# Patient Record
Sex: Male | Born: 1945 | Marital: Married | Smoking: Former smoker
Health system: Southern US, Community
[De-identification: ages and names within clinical notes are randomized; demographics above are authoritative.]

## PROBLEM LIST (undated history)

## (undated) DIAGNOSIS — J449 Chronic obstructive pulmonary disease, unspecified: Secondary | ICD-10-CM

## (undated) DIAGNOSIS — I119 Hypertensive heart disease without heart failure: Secondary | ICD-10-CM

## (undated) DIAGNOSIS — E785 Hyperlipidemia, unspecified: Secondary | ICD-10-CM

## (undated) DIAGNOSIS — R55 Syncope and collapse: Secondary | ICD-10-CM

## (undated) DIAGNOSIS — I2510 Atherosclerotic heart disease of native coronary artery without angina pectoris: Secondary | ICD-10-CM

## (undated) DIAGNOSIS — I428 Other cardiomyopathies: Secondary | ICD-10-CM

## (undated) DIAGNOSIS — I5042 Chronic combined systolic (congestive) and diastolic (congestive) heart failure: Secondary | ICD-10-CM

## (undated) HISTORY — DX: Syncope and collapse: R55

## (undated) HISTORY — DX: Atherosclerotic heart disease of native coronary artery without angina pectoris: I25.10

## (undated) HISTORY — DX: Chronic obstructive pulmonary disease, unspecified: J44.9

## (undated) HISTORY — DX: Other cardiomyopathies: I42.8

## (undated) HISTORY — DX: Hyperlipidemia, unspecified: E78.5

## (undated) HISTORY — DX: Hypertensive heart disease without heart failure: I11.9

## (undated) HISTORY — DX: Chronic combined systolic (congestive) and diastolic (congestive) heart failure: I50.42

---

## 2013-07-23 HISTORY — PX: CARDIAC CATHETERIZATION: SHX172

## 2013-08-05 ENCOUNTER — Inpatient Hospital Stay: Payer: Self-pay | Admitting: Internal Medicine

## 2013-08-05 LAB — CBC
HCT: 42.1 % (ref 40.0–52.0)
HGB: 13.8 g/dL (ref 13.0–18.0)
MCH: 28.1 pg (ref 26.0–34.0)
MCHC: 32.9 g/dL (ref 32.0–36.0)
MCV: 85 fL (ref 80–100)
Platelet: 407 10*3/uL (ref 150–440)
RBC: 4.93 10*6/uL (ref 4.40–5.90)
RDW: 13.6 % (ref 11.5–14.5)
WBC: 9.9 10*3/uL (ref 3.8–10.6)

## 2013-08-05 LAB — BASIC METABOLIC PANEL
ANION GAP: 2 — AB (ref 7–16)
BUN: 15 mg/dL (ref 7–18)
CALCIUM: 8.6 mg/dL (ref 8.5–10.1)
Chloride: 92 mmol/L — ABNORMAL LOW (ref 98–107)
Co2: 33 mmol/L — ABNORMAL HIGH (ref 21–32)
Creatinine: 0.89 mg/dL (ref 0.60–1.30)
EGFR (Non-African Amer.): >60
Glucose: 137 mg/dL — ABNORMAL HIGH (ref 65–99)
Osmolality: 258 (ref 275–301)
POTASSIUM: 4.4 mmol/L (ref 3.5–5.1)
SODIUM: 127 mmol/L — AB (ref 136–145)

## 2013-08-05 LAB — TROPONIN I
TROPONIN-I: 0.06 ng/mL — AB
TROPONIN-I: 0.08 ng/mL — AB

## 2013-08-05 LAB — HEPATIC FUNCTION PANEL A (ARMC)
ALBUMIN: 2.8 g/dL — AB (ref 3.4–5.0)
ALT: 41 U/L (ref 12–78)
Alkaline Phosphatase: 165 U/L — ABNORMAL HIGH
BILIRUBIN DIRECT: 0.2 mg/dL (ref 0.00–0.20)
Bilirubin,Total: 0.6 mg/dL (ref 0.2–1.0)
SGOT(AST): 29 U/L (ref 15–37)
TOTAL PROTEIN: 6.7 g/dL (ref 6.4–8.2)

## 2013-08-05 LAB — CK TOTAL AND CKMB (NOT AT ARMC)
CK, Total: 183 U/L (ref 35–232)
CK-MB: 6.4 ng/mL — AB (ref 0.5–3.6)

## 2013-08-05 LAB — PRO B NATRIURETIC PEPTIDE: B-TYPE NATIURETIC PEPTID: 30491 pg/mL — AB (ref 0–125)

## 2013-08-06 DIAGNOSIS — I059 Rheumatic mitral valve disease, unspecified: Secondary | ICD-10-CM

## 2013-08-06 DIAGNOSIS — I471 Supraventricular tachycardia, unspecified: Secondary | ICD-10-CM

## 2013-08-06 DIAGNOSIS — I10 Essential (primary) hypertension: Secondary | ICD-10-CM

## 2013-08-06 DIAGNOSIS — I428 Other cardiomyopathies: Secondary | ICD-10-CM

## 2013-08-06 DIAGNOSIS — I509 Heart failure, unspecified: Secondary | ICD-10-CM

## 2013-08-06 LAB — CBC WITH DIFFERENTIAL/PLATELET
BASOS PCT: 0.7 %
Basophil #: 0.1 10*3/uL (ref 0.0–0.1)
EOS ABS: 0 10*3/uL (ref 0.0–0.7)
Eosinophil %: 0.4 %
HCT: 38.9 % — AB (ref 40.0–52.0)
HGB: 12.9 g/dL — ABNORMAL LOW (ref 13.0–18.0)
LYMPHS PCT: 19.1 %
Lymphocyte #: 1.7 10*3/uL (ref 1.0–3.6)
MCH: 28.1 pg (ref 26.0–34.0)
MCHC: 33.2 g/dL (ref 32.0–36.0)
MCV: 85 fL (ref 80–100)
MONO ABS: 0.7 x10 3/mm (ref 0.2–1.0)
MONOS PCT: 8.3 %
NEUTROS ABS: 6.3 10*3/uL (ref 1.4–6.5)
NEUTROS PCT: 71.5 %
PLATELETS: 369 10*3/uL (ref 150–440)
RBC: 4.59 10*6/uL (ref 4.40–5.90)
RDW: 13.8 % (ref 11.5–14.5)
WBC: 8.9 10*3/uL (ref 3.8–10.6)

## 2013-08-06 LAB — BASIC METABOLIC PANEL
ANION GAP: 6 — AB (ref 7–16)
BUN: 15 mg/dL (ref 7–18)
CALCIUM: 8.4 mg/dL — AB (ref 8.5–10.1)
CREATININE: 0.85 mg/dL (ref 0.60–1.30)
Chloride: 93 mmol/L — ABNORMAL LOW (ref 98–107)
Co2: 32 mmol/L (ref 21–32)
EGFR (African American): >60
EGFR (Non-African Amer.): >60
Glucose: 87 mg/dL (ref 65–99)
Osmolality: 263 (ref 275–301)
Potassium: 3.6 mmol/L (ref 3.5–5.1)
SODIUM: 131 mmol/L — AB (ref 136–145)

## 2013-08-06 LAB — LIPID PANEL
CHOLESTEROL: 98 mg/dL (ref 0–200)
HDL Cholesterol: 23 mg/dL — ABNORMAL LOW (ref 40–60)
LDL CHOLESTEROL, CALC: 59 mg/dL (ref 0–100)
Triglycerides: 80 mg/dL (ref 0–200)
VLDL CHOLESTEROL, CALC: 16 mg/dL (ref 5–40)

## 2013-08-06 LAB — TROPONIN I: Troponin-I: 0.09 ng/mL — ABNORMAL HIGH

## 2013-08-07 DIAGNOSIS — I5021 Acute systolic (congestive) heart failure: Secondary | ICD-10-CM

## 2013-08-07 LAB — BASIC METABOLIC PANEL
Anion Gap: 5 — ABNORMAL LOW (ref 7–16)
BUN: 17 mg/dL (ref 7–18)
CALCIUM: 8 mg/dL — AB (ref 8.5–10.1)
CHLORIDE: 88 mmol/L — AB (ref 98–107)
Co2: 37 mmol/L — ABNORMAL HIGH (ref 21–32)
Creatinine: 0.88 mg/dL (ref 0.60–1.30)
EGFR (Non-African Amer.): >60
Glucose: 77 mg/dL (ref 65–99)
Osmolality: 261 (ref 275–301)
Potassium: 2.9 mmol/L — ABNORMAL LOW (ref 3.5–5.1)
Sodium: 130 mmol/L — ABNORMAL LOW (ref 136–145)

## 2013-08-07 LAB — MAGNESIUM: Magnesium: 1.4 mg/dL — ABNORMAL LOW

## 2013-08-08 LAB — BASIC METABOLIC PANEL
Anion Gap: 1 — ABNORMAL LOW (ref 7–16)
BUN: 22 mg/dL — ABNORMAL HIGH (ref 7–18)
Calcium, Total: 8.6 mg/dL (ref 8.5–10.1)
Chloride: 86 mmol/L — ABNORMAL LOW (ref 98–107)
Co2: 36 mmol/L — ABNORMAL HIGH (ref 21–32)
Creatinine: 1.06 mg/dL (ref 0.60–1.30)
EGFR (African American): >60
EGFR (Non-African Amer.): >60
Glucose: 147 mg/dL — ABNORMAL HIGH (ref 65–99)
Osmolality: 254 (ref 275–301)
Potassium: 5 mmol/L (ref 3.5–5.1)
Sodium: 123 mmol/L — ABNORMAL LOW (ref 136–145)

## 2013-08-08 LAB — MAGNESIUM: Magnesium: 2.4 mg/dL

## 2013-08-09 LAB — CREATININE, SERUM
Creatinine: 1.28 mg/dL (ref 0.60–1.30)
EGFR (African American): >60
EGFR (Non-African Amer.): 58 — ABNORMAL LOW

## 2013-08-09 LAB — BASIC METABOLIC PANEL
ANION GAP: 6 — AB (ref 7–16)
BUN: 33 mg/dL — ABNORMAL HIGH (ref 7–18)
CHLORIDE: 83 mmol/L — AB (ref 98–107)
CREATININE: 1.11 mg/dL (ref 0.60–1.30)
Calcium, Total: 8.6 mg/dL (ref 8.5–10.1)
Co2: 34 mmol/L — ABNORMAL HIGH (ref 21–32)
EGFR (African American): >60
Glucose: 97 mg/dL (ref 65–99)
Osmolality: 255 (ref 275–301)
Potassium: 5 mmol/L (ref 3.5–5.1)
SODIUM: 123 mmol/L — AB (ref 136–145)

## 2013-08-09 LAB — SODIUM: Sodium: 121 mmol/L — ABNORMAL LOW (ref 136–145)

## 2013-08-09 LAB — BUN: BUN: 34 mg/dL — AB (ref 7–18)

## 2013-08-09 LAB — MAGNESIUM: Magnesium: 2.5 mg/dL — ABNORMAL HIGH

## 2013-08-09 LAB — SODIUM, URINE, RANDOM: Sodium, Urine Random: 14 mmol/L (ref 20–110)

## 2013-08-10 LAB — MAGNESIUM: Magnesium: 1.8 mg/dL

## 2013-08-10 LAB — BASIC METABOLIC PANEL
Anion Gap: 4 — ABNORMAL LOW (ref 7–16)
BUN: 34 mg/dL — ABNORMAL HIGH (ref 7–18)
CO2: 31 mmol/L (ref 21–32)
Calcium, Total: 8.4 mg/dL — ABNORMAL LOW (ref 8.5–10.1)
Chloride: 86 mmol/L — ABNORMAL LOW (ref 98–107)
Creatinine: 0.99 mg/dL (ref 0.60–1.30)
Glucose: 109 mg/dL — ABNORMAL HIGH (ref 65–99)
Osmolality: 252 (ref 275–301)
POTASSIUM: 4.5 mmol/L (ref 3.5–5.1)
SODIUM: 121 mmol/L — AB (ref 136–145)

## 2013-08-10 LAB — TROPONIN I
TROPONIN-I: 0.1 ng/mL — AB
Troponin-I: 0.11 ng/mL — ABNORMAL HIGH
Troponin-I: 0.11 ng/mL — ABNORMAL HIGH

## 2013-08-11 ENCOUNTER — Telehealth: Payer: Self-pay

## 2013-08-11 LAB — BASIC METABOLIC PANEL
Anion Gap: 3 — ABNORMAL LOW (ref 7–16)
BUN: 29 mg/dL — ABNORMAL HIGH (ref 7–18)
Calcium, Total: 8.2 mg/dL — ABNORMAL LOW (ref 8.5–10.1)
Chloride: 86 mmol/L — ABNORMAL LOW (ref 98–107)
Co2: 33 mmol/L — ABNORMAL HIGH (ref 21–32)
Creatinine: 1.11 mg/dL (ref 0.60–1.30)
GLUCOSE: 99 mg/dL (ref 65–99)
Osmolality: 252 (ref 275–301)
Potassium: 4.7 mmol/L (ref 3.5–5.1)
Sodium: 122 mmol/L — ABNORMAL LOW (ref 136–145)

## 2013-08-11 LAB — SODIUM
SODIUM: 123 mmol/L — AB (ref 136–145)
Sodium: 121 mmol/L — ABNORMAL LOW (ref 136–145)
Sodium: 124 mmol/L — ABNORMAL LOW (ref 136–145)
Sodium: 124 mmol/L — ABNORMAL LOW (ref 136–145)
Sodium: 125 mmol/L — ABNORMAL LOW (ref 136–145)

## 2013-08-11 NOTE — Telephone Encounter (Signed)
Pt's wife calling stating that pt is currently inpt at [facility] in room #245. She was told by Dr. Kirke Corin that pt may possibly be scheduled for a cath on Thursday and asked that she call the office to discuss. Advised pt that inpt procedures are usually scheduled thru the hospital, but she states that Dr. Kirke Corin told her to call and speak w/ his nurse.

## 2013-08-11 NOTE — Telephone Encounter (Signed)
I called her

## 2013-08-11 NOTE — Telephone Encounter (Signed)
Patient wife would like to know if Dr. Kirke Corin is for sure doing a cath and if he has not decided yet how and when will he decide. Patients wife states that patient has been confused ever since he has been in hospital so please keep her informed as well.

## 2013-08-12 ENCOUNTER — Telehealth: Payer: Self-pay

## 2013-08-12 LAB — BASIC METABOLIC PANEL
ANION GAP: 3 — AB (ref 7–16)
BUN: 22 mg/dL — ABNORMAL HIGH (ref 7–18)
CHLORIDE: 94 mmol/L — AB (ref 98–107)
CO2: 34 mmol/L — AB (ref 21–32)
CREATININE: 1.04 mg/dL (ref 0.60–1.30)
Calcium, Total: 8.4 mg/dL — ABNORMAL LOW (ref 8.5–10.1)
EGFR (African American): >60
EGFR (Non-African Amer.): >60
GLUCOSE: 85 mg/dL (ref 65–99)
Osmolality: 265 (ref 275–301)
POTASSIUM: 3.7 mmol/L (ref 3.5–5.1)
Sodium: 131 mmol/L — ABNORMAL LOW (ref 136–145)

## 2013-08-12 LAB — SODIUM
SODIUM: 132 mmol/L — AB (ref 136–145)
Sodium: 130 mmol/L — ABNORMAL LOW (ref 136–145)
Sodium: 132 mmol/L — ABNORMAL LOW (ref 136–145)
Sodium: 135 mmol/L — ABNORMAL LOW (ref 136–145)
Sodium: 136 mmol/L (ref 136–145)

## 2013-08-12 NOTE — Telephone Encounter (Signed)
Dr. Kirke Corin returned call to patient.

## 2013-08-12 NOTE — Telephone Encounter (Signed)
Patient contacted regarding discharge from [facility] on 08/12/13.  Patient understands to follow up with Dr. Kirke Corin on 08/21/13 at 3:45 at [facility]. Patient understands discharge instructions? yes Patient understands medications and regiment? yes Patient understands to bring all medications to this visit? yes  Spoke w/ pt's wife.  She reports that pt has not yet been released from the hospital, as Dr. Kirke Corin is planning on performing a cath tomorrow. Advised her of pt's f/u w/ Dr. Kirke Corin and that will contact her if this changes.  She is agreeable to this and will call us w/ any questions or concerns.

## 2013-08-13 ENCOUNTER — Encounter: Payer: Self-pay | Admitting: Cardiovascular Disease

## 2013-08-13 ENCOUNTER — Encounter: Payer: Self-pay | Admitting: *Deleted

## 2013-08-13 DIAGNOSIS — I2510 Atherosclerotic heart disease of native coronary artery without angina pectoris: Secondary | ICD-10-CM

## 2013-08-13 DIAGNOSIS — E871 Hypo-osmolality and hyponatremia: Secondary | ICD-10-CM

## 2013-08-13 LAB — SODIUM
SODIUM: 134 mmol/L — AB (ref 136–145)
SODIUM: 131 mmol/L — AB (ref 136–145)
Sodium: 130 mmol/L — ABNORMAL LOW (ref 136–145)
Sodium: 130 mmol/L — ABNORMAL LOW (ref 136–145)

## 2013-08-13 LAB — BASIC METABOLIC PANEL
ANION GAP: 3 — AB (ref 7–16)
BUN: 22 mg/dL — ABNORMAL HIGH (ref 7–18)
CHLORIDE: 98 mmol/L (ref 98–107)
Calcium, Total: 8.4 mg/dL — ABNORMAL LOW (ref 8.5–10.1)
Co2: 32 mmol/L (ref 21–32)
Creatinine: 0.87 mg/dL (ref 0.60–1.30)
EGFR (African American): >60
EGFR (Non-African Amer.): >60
Glucose: 116 mg/dL — ABNORMAL HIGH (ref 65–99)
OSMOLALITY: 271 (ref 275–301)
POTASSIUM: 3.9 mmol/L (ref 3.5–5.1)
Sodium: 133 mmol/L — ABNORMAL LOW (ref 136–145)

## 2013-08-13 LAB — MAGNESIUM: Magnesium: 2 mg/dL

## 2013-08-14 DIAGNOSIS — I10 Essential (primary) hypertension: Secondary | ICD-10-CM

## 2013-08-14 DIAGNOSIS — I5021 Acute systolic (congestive) heart failure: Secondary | ICD-10-CM

## 2013-08-14 LAB — SODIUM
SODIUM: 131 mmol/L — AB (ref 136–145)
SODIUM: 130 mmol/L — AB (ref 136–145)

## 2013-08-14 LAB — BASIC METABOLIC PANEL
ANION GAP: 3 — AB (ref 7–16)
BUN: 20 mg/dL — ABNORMAL HIGH (ref 7–18)
CHLORIDE: 94 mmol/L — AB (ref 98–107)
CO2: 33 mmol/L — AB (ref 21–32)
Calcium, Total: 8 mg/dL — ABNORMAL LOW (ref 8.5–10.1)
Creatinine: 0.88 mg/dL (ref 0.60–1.30)
EGFR (African American): >60
EGFR (Non-African Amer.): >60
Glucose: 87 mg/dL (ref 65–99)
OSMOLALITY: 263 (ref 275–301)
POTASSIUM: 3.7 mmol/L (ref 3.5–5.1)
Sodium: 130 mmol/L — ABNORMAL LOW (ref 136–145)

## 2013-08-17 ENCOUNTER — Encounter: Payer: Self-pay | Admitting: Cardiovascular Disease

## 2013-08-20 ENCOUNTER — Encounter: Payer: Self-pay | Admitting: *Deleted

## 2013-08-21 ENCOUNTER — Ambulatory Visit (INDEPENDENT_AMBULATORY_CARE_PROVIDER_SITE_OTHER): Payer: Medicare Other | Admitting: Cardiovascular Disease

## 2013-08-21 ENCOUNTER — Encounter: Payer: Self-pay | Admitting: Cardiovascular Disease

## 2013-08-21 ENCOUNTER — Encounter: Payer: Medicare Other | Admitting: Cardiovascular Disease

## 2013-08-21 VITALS — BP 104/82 | HR 99 | Ht 70.0 in | Wt 125.2 lb

## 2013-08-21 DIAGNOSIS — I10 Essential (primary) hypertension: Secondary | ICD-10-CM

## 2013-08-21 DIAGNOSIS — E785 Hyperlipidemia, unspecified: Secondary | ICD-10-CM

## 2013-08-21 DIAGNOSIS — I2510 Atherosclerotic heart disease of native coronary artery without angina pectoris: Secondary | ICD-10-CM

## 2013-08-21 DIAGNOSIS — I5032 Chronic diastolic (congestive) heart failure: Secondary | ICD-10-CM

## 2013-08-21 DIAGNOSIS — R64 Cachexia: Secondary | ICD-10-CM

## 2013-08-21 DIAGNOSIS — R079 Chest pain, unspecified: Secondary | ICD-10-CM

## 2013-08-21 DIAGNOSIS — I5022 Chronic systolic (congestive) heart failure: Secondary | ICD-10-CM

## 2013-08-21 MED ORDER — FUROSEMIDE 40 MG PO TABS: 40.0000 mg | ORAL_TABLET | Freq: Every day | ORAL | Status: DC

## 2013-08-21 NOTE — Patient Instructions (Signed)
Your physician has recommended you make the following change in your medication:  Increase Lasix to 40 mg daily    Your physician recommends that you return for lab work in:  Today Basic metabolic panel    Your physician recommends that you schedule a follow-up appointment in:  2 weeks

## 2013-08-22 ENCOUNTER — Encounter: Payer: Self-pay | Admitting: Cardiovascular Disease

## 2013-08-22 DIAGNOSIS — I5022 Chronic systolic (congestive) heart failure: Secondary | ICD-10-CM | POA: Insufficient documentation

## 2013-08-22 DIAGNOSIS — E785 Hyperlipidemia, unspecified: Secondary | ICD-10-CM | POA: Insufficient documentation

## 2013-08-22 DIAGNOSIS — I2510 Atherosclerotic heart disease of native coronary artery without angina pectoris: Secondary | ICD-10-CM | POA: Insufficient documentation

## 2013-08-22 DIAGNOSIS — I10 Essential (primary) hypertension: Secondary | ICD-10-CM | POA: Insufficient documentation

## 2013-08-22 DIAGNOSIS — R64 Cachexia: Secondary | ICD-10-CM | POA: Insufficient documentation

## 2013-08-22 LAB — BASIC METABOLIC PANEL
BUN / CREAT RATIO: 18 (ref 11–26)
BUN: 14 mg/dL (ref 8–27)
CO2: 33 mmol/L — ABNORMAL HIGH (ref 18–29)
CREATININE: 0.78 mg/dL (ref 0.57–1.00)
Calcium: 8.1 mg/dL — ABNORMAL LOW (ref 8.7–10.3)
Chloride: 82 mmol/L — ABNORMAL LOW (ref 97–108)
GFR, EST AFRICAN AMERICAN: 91 mL/min/{1.73_m2} (ref >59)
GFR, EST NON AFRICAN AMERICAN: 79 mL/min/{1.73_m2} (ref >59)
Glucose: 95 mg/dL (ref 65–99)
Potassium: 4.1 mmol/L (ref 3.5–5.2)
SODIUM: 128 mmol/L — AB (ref 134–144)

## 2013-08-22 NOTE — Assessment & Plan Note (Signed)
He appears to be mildly fluid overloaded. I recommend increasing the dose of Lasix to 40 mg once daily. Continue treatment with carvedilol and lisinopril. I will try to gradually up titrate his medications especially the beta blocker given his resting tachycardia. Check basic metabolic profile today given his recent problems with hyponatremia. I advised him to continue with fluid restriction to 50 ounces of fluids a day. His overall prognosis is poor.

## 2013-08-22 NOTE — Assessment & Plan Note (Signed)
Given his history of CAD, he would benefit from being on a statin. I will discuss this with him upon followup.

## 2013-08-22 NOTE — Progress Notes (Signed)
Primary care physician: Dr. Gustavus Messing, MD at [facility] primary care in [place].   HPI  This is a 68 year old man who is here today for a followup visit after recent hospitalization at [facility]. He had questionable history of cardiomyopathy in the past with reported ejection fraction of 35% in 2004. He has known history of COPD tobacco use. He recently presented to his PCPs office with worsening dyspnea. He was noted to be in congestive heart failure and was sent to the emergency room at [facility]. The patient was significantly decompensated with significant fluid overload. Echocardiogram showed an ejection fraction of 15-20% with mild mitral regurgitation and calcified aortic valve without significant stenosis. He was started on heart failure medications. Dyspnea improved with diuresis and treating his COPD. He developed significant hyponatremia with changes in his mental status. This subsequently improved. He underwent a right and left cardiac catheterization which showed single-vessel coronary artery disease which did not explain his degree of cardiomyopathy. The patient also reported significant weight loss over the last year. Occult malignancy could not be excluded. Since hospital discharge, he has been doing reasonably well. However, his wife reports gradually worsening of dyspnea and lower extremity edema. He gained about 5 pounds. He continues to have episodes of confusion at night. He has been taking his medications regularly.  No Known Allergies   Current Outpatient Prescriptions on File Prior to Visit  Medication Sig Dispense Refill  . albuterol (PROVENTIL HFA;VENTOLIN HFA) 108 (90 BASE) MCG/ACT inhaler Inhale 1 puff into the lungs 4 (four) times daily as needed for wheezing or shortness of breath.      . carvedilol (COREG) 6.25 MG tablet Take 6.25 mg by mouth 2 (two) times daily with a meal.      . ENSURE PLUS (ENSURE PLUS) LIQD Take 1 Can by mouth 3 (three) times daily.      .  Fluticasone-Salmeterol (ADVAIR) 250-50 MCG/DOSE AEPB Inhale 1 puff into the lungs 2 (two) times daily.      Marland Kitchen lisinopril (PRINIVIL,ZESTRIL) 5 MG tablet Take 5 mg by mouth daily.      . magnesium oxide (MAG-OX) 400 MG tablet Take 400 mg by mouth daily.      Marland Kitchen tiotropium (SPIRIVA) 18 MCG inhalation capsule Place 18 mcg into inhaler and inhale daily.       No current facility-administered medications on file prior to visit.     Past Medical History  Diagnosis Date  . Cardiomyopathy   . COPD (chronic obstructive pulmonary disease)   . CHF (congestive heart failure)   . MI (myocardial infarction)   . Syncope and collapse   . Chronic systolic heart failure     Most recent ejection fraction of 15-20% in January of 2015. Previous ejection fraction of 35% in 2004  . Coronary artery disease     Cardiac catheterization in January of 2015 showed severe one-vessel coronary artery disease with heavily calcified left circumflex and OM 1. Cardiomyopathy is out of proportion to coronary artery disease.  Marland Kitchen Hyperlipidemia   . Hypertension      Past Surgical History  Procedure Laterality Date  . Cardiac catheterization  1/15    [facility]     Family History  Problem Relation Age of Onset  . Heart disease Father   . Emphysema Father      History   Social History  . Marital Status: Married    Spouse Name: N/A    Number of Children: N/A  . Years of Education: N/A  Occupational History  . Not on file.   Social History Main Topics  . Smoking status: Current Every Day Smoker -- 1.00 packs/day for 40 years    Types: Cigarettes  . Smokeless tobacco: Not on file  . Alcohol Use: No  . Drug Use: No  . Sexual Activity: Not on file   Other Topics Concern  . Not on file   Social History Narrative  . No narrative on file      PHYSICAL EXAM   BP 104/82  Pulse 99  Ht 5\' 10"  (1.778 m)  Wt 125 lb 4 oz (56.813 kg)  BMI 17.97 kg/m2 Constitutional: He is oriented to person, place, and  time. He appears well-developed and well-nourished. No distress.  HENT: No nasal discharge.  Head: Normocephalic and atraumatic.  Eyes: Pupils are equal and round.  No discharge. Neck: Normal range of motion. Neck supple. Mild JVD present. No thyromegaly present.  Cardiovascular: Normal rate, regular rhythm, normal heart sounds. Exam reveals no gallop and no friction rub. No murmur heard.  Pulmonary/Chest: Effort normal and diminished breath sounds. No stridor. No respiratory distress. He has no wheezes. He has no rales. He exhibits no tenderness.  Abdominal: Soft. Bowel sounds are normal. He exhibits no distension. There is no tenderness. There is no rebound and no guarding.  Musculoskeletal: Normal range of motion. He exhibits mild edema and no tenderness.  Neurological: He is alert and oriented to person, place, and time. Coordination normal.  Skin: Skin is warm and dry. No rash noted. He is not diaphoretic. No erythema. No pallor.  Psychiatric: He has a normal mood and affect. His behavior is normal. Judgment and thought content normal.       EKG: Sinus rhythm with frequent PVCs, right atrial enlargement, significant ST and T-wave changes suggestive of anterolateral ischemia.   ASSESSMENT AND PLAN

## 2013-08-22 NOTE — Assessment & Plan Note (Signed)
He does have one vessel coronary artery disease but his cardiomyopathy is clearly out of proportion to the degree of CAD. Currently his symptoms are consistent with heart failure and not angina. I recommend continuing medical therapy.

## 2013-08-22 NOTE — Assessment & Plan Note (Signed)
This could be due to his heart failure. However, age-appropriate screening for cancer is recommended. He is to followup with his primary care physician about this.

## 2013-08-22 NOTE — Assessment & Plan Note (Signed)
Blood pressure is controlled on current medications. 

## 2013-08-26 ENCOUNTER — Encounter: Payer: Self-pay | Admitting: *Deleted

## 2013-08-27 ENCOUNTER — Inpatient Hospital Stay: Payer: Self-pay | Admitting: Internal Medicine

## 2013-08-27 LAB — BASIC METABOLIC PANEL
ANION GAP: 5 — AB (ref 7–16)
BUN: 12 mg/dL (ref 7–18)
CALCIUM: 8.4 mg/dL — AB (ref 8.5–10.1)
CHLORIDE: 86 mmol/L — AB (ref 98–107)
Co2: 39 mmol/L — ABNORMAL HIGH (ref 21–32)
Creatinine: 0.74 mg/dL (ref 0.60–1.30)
GLUCOSE: 82 mg/dL (ref 65–99)
OSMOLALITY: 260 (ref 275–301)
Potassium: 2.7 mmol/L — ABNORMAL LOW (ref 3.5–5.1)
SODIUM: 130 mmol/L — AB (ref 136–145)

## 2013-08-27 LAB — CBC WITH DIFFERENTIAL/PLATELET
BASOS PCT: 0.5 %
Basophil #: 0 10*3/uL (ref 0.0–0.1)
Eosinophil #: 0 10*3/uL (ref 0.0–0.7)
Eosinophil %: 0.3 %
HCT: 38.6 % — ABNORMAL LOW (ref 40.0–52.0)
HGB: 12.8 g/dL — ABNORMAL LOW (ref 13.0–18.0)
LYMPHS PCT: 18.4 %
Lymphocyte #: 1.4 10*3/uL (ref 1.0–3.6)
MCH: 28.5 pg (ref 26.0–34.0)
MCHC: 33.3 g/dL (ref 32.0–36.0)
MCV: 86 fL (ref 80–100)
MONO ABS: 0.8 x10 3/mm (ref 0.2–1.0)
MONOS PCT: 9.8 %
NEUTROS ABS: 5.4 10*3/uL (ref 1.4–6.5)
NEUTROS PCT: 71 %
Platelet: 277 10*3/uL (ref 150–440)
RBC: 4.5 10*6/uL (ref 4.40–5.90)
RDW: 14.9 % — ABNORMAL HIGH (ref 11.5–14.5)
WBC: 7.7 10*3/uL (ref 3.8–10.6)

## 2013-08-27 LAB — PRO B NATRIURETIC PEPTIDE: B-Type Natriuretic Peptide: 33180 pg/mL — ABNORMAL HIGH (ref 0–125)

## 2013-08-27 LAB — TROPONIN I: Troponin-I: 0.04 ng/mL

## 2013-08-28 LAB — CBC WITH DIFFERENTIAL/PLATELET
BASOS PCT: 0.3 %
Basophil #: 0 10*3/uL (ref 0.0–0.1)
EOS PCT: 0.2 %
Eosinophil #: 0 10*3/uL (ref 0.0–0.7)
HCT: 35.1 % — AB (ref 40.0–52.0)
HGB: 11.9 g/dL — ABNORMAL LOW (ref 13.0–18.0)
Lymphocyte #: 1.6 10*3/uL (ref 1.0–3.6)
Lymphocyte %: 22 %
MCH: 29 pg (ref 26.0–34.0)
MCHC: 34 g/dL (ref 32.0–36.0)
MCV: 85 fL (ref 80–100)
MONO ABS: 0.7 x10 3/mm (ref 0.2–1.0)
Monocyte %: 9.7 %
NEUTROS PCT: 67.8 %
Neutrophil #: 4.9 10*3/uL (ref 1.4–6.5)
Platelet: 239 10*3/uL (ref 150–440)
RBC: 4.11 10*6/uL — AB (ref 4.40–5.90)
RDW: 14.5 % (ref 11.5–14.5)
WBC: 7.2 10*3/uL (ref 3.8–10.6)

## 2013-08-28 LAB — BASIC METABOLIC PANEL
Anion Gap: 1 — ABNORMAL LOW (ref 7–16)
BUN: 17 mg/dL (ref 7–18)
Calcium, Total: 8.2 mg/dL — ABNORMAL LOW (ref 8.5–10.1)
Chloride: 87 mmol/L — ABNORMAL LOW (ref 98–107)
Co2: 37 mmol/L — ABNORMAL HIGH (ref 21–32)
Creatinine: 0.96 mg/dL (ref 0.60–1.30)
EGFR (Non-African Amer.): >60
Glucose: 97 mg/dL (ref 65–99)
Osmolality: 253 (ref 275–301)
Potassium: 3.8 mmol/L (ref 3.5–5.1)
Sodium: 125 mmol/L — ABNORMAL LOW (ref 136–145)

## 2013-08-28 LAB — SODIUM
SODIUM: 131 mmol/L — AB (ref 136–145)
Sodium: 127 mmol/L — ABNORMAL LOW (ref 136–145)
Sodium: 129 mmol/L — ABNORMAL LOW (ref 136–145)

## 2013-08-29 LAB — BASIC METABOLIC PANEL
ANION GAP: 2 — AB (ref 7–16)
BUN: 24 mg/dL — ABNORMAL HIGH (ref 7–18)
CO2: 36 mmol/L — AB (ref 21–32)
CREATININE: 0.97 mg/dL (ref 0.60–1.30)
Calcium, Total: 8.3 mg/dL — ABNORMAL LOW (ref 8.5–10.1)
Chloride: 88 mmol/L — ABNORMAL LOW (ref 98–107)
EGFR (African American): >60
EGFR (Non-African Amer.): >60
Glucose: 104 mg/dL — ABNORMAL HIGH (ref 65–99)
Osmolality: 258 (ref 275–301)
Potassium: 4.4 mmol/L (ref 3.5–5.1)
SODIUM: 126 mmol/L — AB (ref 136–145)

## 2013-08-29 LAB — SODIUM
SODIUM: 128 mmol/L — AB (ref 136–145)
SODIUM: 129 mmol/L — AB (ref 136–145)
SODIUM: 127 mmol/L — AB (ref 136–145)
Sodium: 127 mmol/L — ABNORMAL LOW (ref 136–145)

## 2013-08-30 LAB — SODIUM
Sodium: 129 mmol/L — ABNORMAL LOW (ref 136–145)
Sodium: 130 mmol/L — ABNORMAL LOW (ref 136–145)

## 2013-09-03 ENCOUNTER — Ambulatory Visit (INDEPENDENT_AMBULATORY_CARE_PROVIDER_SITE_OTHER): Payer: Medicare Other | Admitting: Cardiovascular Disease

## 2013-09-03 ENCOUNTER — Encounter: Payer: Self-pay | Admitting: Cardiovascular Disease

## 2013-09-03 VITALS — BP 129/82 | HR 101 | Ht 67.0 in | Wt 114.5 lb

## 2013-09-03 DIAGNOSIS — I2510 Atherosclerotic heart disease of native coronary artery without angina pectoris: Secondary | ICD-10-CM

## 2013-09-03 DIAGNOSIS — I5022 Chronic systolic (congestive) heart failure: Secondary | ICD-10-CM

## 2013-09-03 DIAGNOSIS — I10 Essential (primary) hypertension: Secondary | ICD-10-CM

## 2013-09-03 DIAGNOSIS — R64 Cachexia: Secondary | ICD-10-CM

## 2013-09-03 MED ORDER — SPIRONOLACTONE 25 MG PO TABS: 25.0000 mg | ORAL_TABLET | Freq: Every day | ORAL | Status: DC

## 2013-09-03 NOTE — Progress Notes (Signed)
Primary care physician: Dr. Gustavus MessingVicki Fowler Ingledue, MD at [facility] primary care in [place].   HPI  This is a 68 year old man who is here today for a followup visit regarding chronic systolic heart failure with severely reduced LV systolic function.  He had questionable history of cardiomyopathy in the past with reported ejection fraction of 35% in 2004. He has known history of severe COPD and prolonged tobacco use. He was hospitalized at [facility] in January for congestive heart failure . Echocardiogram showed an ejection fraction of 15-20% with mild mitral regurgitation and calcified aortic valve without significant stenosis. He was started on heart failure medications. Dyspnea improved with diuresis and treating his COPD. He developed significant hyponatremia with changes in his mental status. This subsequently improved. He underwent a right and left cardiac catheterization which showed single-vessel coronary artery disease which did not explain his degree of cardiomyopathy. No intervention was performed. During last visit, I increased his Lasix to 40 mg once daily. He was rehospitalized on February 5 th with acute on chronic heart failure with hyponatremia. He was started on Tolvaptan but could not get this medication upon discharge.  He continues to have significant dyspnea. He is asking his wife for a cigarette all the time. 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 He continues to have episodes of mental status changes   No Known Allergies   Current Outpatient Prescriptions on File Prior to Visit  Medication Sig Dispense Refill  . albuterol (PROVENTIL HFA;VENTOLIN HFA) 108 (90 BASE) MCG/ACT inhaler Inhale 1 puff into the lungs 4 (four) times daily as needed for wheezing or shortness of breath.      Marland Kitchen. aspirin 325 MG tablet Take 325 mg by mouth daily.      . carvedilol (COREG) 6.25 MG tablet Take 6.25 mg by mouth 2 (two) times daily with a meal.      . ENSURE PLUS (ENSURE PLUS) LIQD Take 1 Can  by mouth 3 (three) times daily.      . Fluticasone-Salmeterol (ADVAIR) 250-50 MCG/DOSE AEPB Inhale 1 puff into the lungs 2 (two) times daily.      . furosemide (LASIX) 40 MG tablet Take 1 tablet (40 mg total) by mouth daily.  90 tablet  3  . lisinopril (PRINIVIL,ZESTRIL) 5 MG tablet Take 5 mg by mouth daily.      . magnesium oxide (MAG-OX) 400 MG tablet Take 400 mg by mouth daily.      Marland Kitchen. tiotropium (SPIRIVA) 18 MCG inhalation capsule Place 18 mcg into inhaler and inhale daily.       No current facility-administered medications on file prior to visit.     Past Medical History  Diagnosis Date  . Cardiomyopathy   . COPD (chronic obstructive pulmonary disease)   . CHF (congestive heart failure)   . MI (myocardial infarction)   . Syncope and collapse   . Chronic systolic heart failure     Most recent ejection fraction of 15-20% in January of 2015. Previous ejection fraction of 35% in 2004  . Coronary artery disease     Cardiac catheterization in January of 2015 showed severe one-vessel coronary artery disease with heavily calcified left circumflex and OM 1. Cardiomyopathy is out of proportion to coronary artery disease.  Marland Kitchen. Hyperlipidemia   . Hypertension      Past Surgical History  Procedure Laterality Date  . Cardiac catheterization  1/15    [facility]     Family History  Problem Relation Age of Onset  .  Heart disease Father   . Emphysema Father   . Heart attack Father      History   Social History  . Marital Status: Married    Spouse Name: N/A    Number of Children: N/A  . Years of Education: N/A   Occupational History  . Not on file.   Social History Main Topics  . Smoking status: Never Smoker   . Smokeless tobacco: Not on file  . Alcohol Use: No  . Drug Use: No  . Sexual Activity: Not on file   Other Topics Concern  . Not on file   Social History Narrative  . No narrative on file      PHYSICAL EXAM   BP 129/82  Pulse 101  Ht 5\' 7"  (1.702 m)  Wt 114  lb 8 oz (51.937 kg)  BMI 17.93 kg/m2 oxygen saturation is 96% on room Constitutional: He is oriented to person, place, and time. He appears cachectic. No distress.  HENT: No nasal discharge.  Head: Normocephalic and atraumatic.  Eyes: Pupils are equal and round.  No discharge. Neck: Normal range of motion. Neck supple. Mild JVD present. No thyromegaly present.  Cardiovascular: Normal rate, regular rhythm, normal heart sounds. Exam reveals no gallop and no friction rub. No murmur heard.  Pulmonary/Chest: Effort normal and diminished breath sounds. No stridor. No respiratory distress. He has no wheezes. He has no rales. He exhibits no tenderness.  Abdominal: Soft. Bowel sounds are normal. He exhibits no distension. There is no tenderness. There is no rebound and no guarding.  Musculoskeletal: Normal range of motion. He exhibits mild edema and no tenderness.  Neurological: He is alert and oriented to person, place, and time. Coordination normal.  Skin: Skin is warm and dry. No rash noted. He is not diaphoretic. No erythema. No pallor.  Psychiatric: He has a normal mood and affect. His behavior is normal. Judgment and thought content normal.       EKG: Sinus tachycardia with a PVC, incomplete left bundle-branch block, right atrial enlargement, significant ST and T-wave changes suggestive of anterolateral ischemia.   ASSESSMENT AND PLAN

## 2013-09-03 NOTE — Patient Instructions (Signed)
Your physician has recommended you make the following change in your medication:  Start Aldactone 25 mg daily  Your physician recommends that you schedule a follow-up appointment in:  2 weeks   Your physician recommends that you return for lab work in:  Basic Metabolic Panel Next week. This can be done with [place], your home nurse, or with your primary MD.

## 2013-09-04 ENCOUNTER — Encounter: Payer: Self-pay | Admitting: Cardiovascular Disease

## 2013-09-04 NOTE — Assessment & Plan Note (Signed)
The patient appears to be euvolemic today. However, he continues to be very symptomatic from heart failure and currently New York Heart Association class III. Also, he has significant COPD which is likely contributing. Continued cachexia is very concerning and overall prognosis seems to be poor. I added spironolactone today 25 mg once daily. Check basic metabolic profile in early next week. Continue treatment with carvedilol and lisinopril.

## 2013-09-04 NOTE — Assessment & Plan Note (Signed)
This continues to be a major issue. Underlying malignancy has not been fully excluded.

## 2013-09-04 NOTE — Assessment & Plan Note (Signed)
Blood pressure is reasonably controlled on current medications. 

## 2013-09-04 NOTE — Assessment & Plan Note (Signed)
Continue medical therapy. He has no convincing symptoms of angina.

## 2013-09-16 ENCOUNTER — Other Ambulatory Visit: Payer: Self-pay

## 2013-09-16 MED ORDER — CARVEDILOL 6.25 MG PO TABS: 6.2500 mg | ORAL_TABLET | Freq: Two times a day (BID) | ORAL | Status: DC

## 2013-09-17 ENCOUNTER — Ambulatory Visit: Payer: Medicare Other | Admitting: Cardiovascular Disease

## 2013-09-21 ENCOUNTER — Encounter: Payer: Self-pay | Admitting: Cardiovascular Disease

## 2013-09-21 ENCOUNTER — Ambulatory Visit (INDEPENDENT_AMBULATORY_CARE_PROVIDER_SITE_OTHER): Payer: Medicare Other | Admitting: Cardiovascular Disease

## 2013-09-21 VITALS — BP 128/84 | HR 99 | Ht 70.0 in | Wt 117.5 lb

## 2013-09-21 DIAGNOSIS — R079 Chest pain, unspecified: Secondary | ICD-10-CM

## 2013-09-21 DIAGNOSIS — E785 Hyperlipidemia, unspecified: Secondary | ICD-10-CM

## 2013-09-21 DIAGNOSIS — I10 Essential (primary) hypertension: Secondary | ICD-10-CM

## 2013-09-21 DIAGNOSIS — I2510 Atherosclerotic heart disease of native coronary artery without angina pectoris: Secondary | ICD-10-CM

## 2013-09-21 DIAGNOSIS — I5022 Chronic systolic (congestive) heart failure: Secondary | ICD-10-CM

## 2013-09-21 MED ORDER — DIGOXIN 125 MCG PO TABS: 0.1250 mg | ORAL_TABLET | Freq: Every day | ORAL | Status: DC

## 2013-09-21 NOTE — Assessment & Plan Note (Signed)
He continues to have significant fatigue and low energy likely due to low cardiac output. I added digoxin 0.125 mg once daily. Continue other medications. I will attempt to increase the dose of carvedilol in one month. He currently appears to be euvolemic. He reports having basic metabolic profile done last the week with home nurse. I will request these results.

## 2013-09-21 NOTE — Assessment & Plan Note (Signed)
Continue medical therapy for now as most of his symptoms are related to congestive heart failure.

## 2013-09-21 NOTE — Assessment & Plan Note (Signed)
Given his history of CAD, he would benefit from being on a statin.

## 2013-09-21 NOTE — Progress Notes (Signed)
Primary care physician: Dr. Gustavus Messing, MD at [facility] primary care in [place].   HPI  This is a 68 year old man who is here today for a followup visit regarding chronic systolic heart failure with severely reduced LV systolic function.  He had questionable history of cardiomyopathy in the past with reported ejection fraction of 35% in 2004. He has known history of severe COPD and prolonged tobacco use. He was hospitalized at [facility] in January for congestive heart failure . Echocardiogram showed an ejection fraction of 15-20% with mild mitral regurgitation and calcified aortic valve without significant stenosis. He was started on heart failure medications. Dyspnea improved with diuresis and treating his COPD. He developed significant hyponatremia with changes in his mental status. This subsequently improved. He underwent a right and left cardiac catheterization which showed single-vessel coronary artery disease which did not explain his degree of cardiomyopathy. No intervention was performed.  He was rehospitalized again on February 5 th with acute on chronic heart failure with hyponatremia. He was started on Tolvaptan but could not get this medication upon discharge.   During last visit, I started him on spironolactone. He has been stable overall. He continues to have significant exertional dyspnea and weakness. He had one episode of chest discomfort that responded to one sublingual nitroglycerin. He was given home oxygen and feels better with that.   No Known Allergies   Current Outpatient Prescriptions on File Prior to Visit  Medication Sig Dispense Refill  . albuterol (PROVENTIL HFA;VENTOLIN HFA) 108 (90 BASE) MCG/ACT inhaler Inhale 1 puff into the lungs 4 (four) times daily as needed for wheezing or shortness of breath.      Marland Kitchen aspirin 325 MG tablet Take 325 mg by mouth daily.      . carvedilol (COREG) 6.25 MG tablet Take 1 tablet (6.25 mg total) by mouth 2 (two) times daily with a meal.   60 tablet  3  . ENSURE PLUS (ENSURE PLUS) LIQD Take 1 Can by mouth 3 (three) times daily.      . Fluticasone-Salmeterol (ADVAIR) 250-50 MCG/DOSE AEPB Inhale 1 puff into the lungs 2 (two) times daily.      Marland Kitchen lisinopril (PRINIVIL,ZESTRIL) 5 MG tablet Take 5 mg by mouth daily.      . magnesium oxide (MAG-OX) 400 MG tablet Take 400 mg by mouth daily.      . nitroGLYCERIN (NITROSTAT) 0.4 MG SL tablet Place 0.4 mg under the tongue every 5 (five) minutes as needed.       Marland Kitchen spironolactone (ALDACTONE) 25 MG tablet Take 1 tablet (25 mg total) by mouth daily.  90 tablet  3  . tiotropium (SPIRIVA) 18 MCG inhalation capsule Place 18 mcg into inhaler and inhale daily.       No current facility-administered medications on file prior to visit.     Past Medical History  Diagnosis Date  . Cardiomyopathy   . COPD (chronic obstructive pulmonary disease)   . CHF (congestive heart failure)   . MI (myocardial infarction)   . Syncope and collapse   . Chronic systolic heart failure     Most recent ejection fraction of 15-20% in January of 2015. Previous ejection fraction of 35% in 2004  . Coronary artery disease     Cardiac catheterization in January of 2015 showed severe one-vessel coronary artery disease with heavily calcified left circumflex and OM 1. Cardiomyopathy is out of proportion to coronary artery disease.  Marland Kitchen Hyperlipidemia   . Hypertension  Past Surgical History  Procedure Laterality Date  . Cardiac catheterization  1/15    [facility]     Family History  Problem Relation Age of Onset  . Heart disease Father   . Emphysema Father   . Heart attack Father      History   Social History  . Marital Status: Married    Spouse Name: N/A    Number of Children: N/A  . Years of Education: N/A   Occupational History  . Not on file.   Social History Main Topics  . Smoking status: Never Smoker   . Smokeless tobacco: Not on file  . Alcohol Use: No  . Drug Use: No  . Sexual Activity: Not  on file   Other Topics Concern  . Not on file   Social History Narrative  . No narrative on file      PHYSICAL EXAM   BP 128/84  Pulse 99  Ht 5\' 10"  (1.778 m)  Wt 117 lb 8 oz (53.298 kg)  BMI 16.86 kg/m2 oxygen saturation is 96% on room Constitutional: He is oriented to person, place, and time. He appears cachectic. No distress.  HENT: No nasal discharge.  Head: Normocephalic and atraumatic.  Eyes: Pupils are equal and round.  No discharge. Neck: Normal range of motion. Neck supple. Mild JVD present. No thyromegaly present.  Cardiovascular: Normal rate, regular rhythm, normal heart sounds. Exam reveals no gallop and no friction rub. No murmur heard.  Pulmonary/Chest: Effort normal and diminished breath sounds. No stridor. No respiratory distress. He has no wheezes. He has no rales. He exhibits no tenderness.  Abdominal: Soft. Bowel sounds are normal. He exhibits no distension. There is no tenderness. There is no rebound and no guarding.  Musculoskeletal: Normal range of motion. He exhibits mild edema and no tenderness.  Neurological: He is alert and oriented to person, place, and time. Coordination normal.  Skin: Skin is warm and dry. No rash noted. He is not diaphoretic. No erythema. No pallor.  Psychiatric: He has a normal mood and affect. His behavior is normal. Judgment and thought content normal.       EKG: Sinus tachycardia with frequent PVCs, incomplete left bundle-branch block, right atrial enlargement, significant ST and T-wave changes suggestive of anterolateral ischemia.   ASSESSMENT AND PLAN

## 2013-09-21 NOTE — Assessment & Plan Note (Signed)
Blood pressure is controlled somewhat on the low side.

## 2013-09-21 NOTE — Patient Instructions (Signed)
Start Digoxin 0.125 mg once daily.  Continue other medications.   Follow up in 1 month.  

## 2013-10-26 ENCOUNTER — Ambulatory Visit (INDEPENDENT_AMBULATORY_CARE_PROVIDER_SITE_OTHER): Payer: Medicare Other | Admitting: Cardiovascular Disease

## 2013-10-26 ENCOUNTER — Encounter: Payer: Self-pay | Admitting: Cardiovascular Disease

## 2013-10-26 VITALS — BP 118/75 | HR 78 | Ht 67.0 in | Wt 118.0 lb

## 2013-10-26 DIAGNOSIS — I5022 Chronic systolic (congestive) heart failure: Secondary | ICD-10-CM

## 2013-10-26 DIAGNOSIS — I2510 Atherosclerotic heart disease of native coronary artery without angina pectoris: Secondary | ICD-10-CM

## 2013-10-26 DIAGNOSIS — R55 Syncope and collapse: Secondary | ICD-10-CM | POA: Insufficient documentation

## 2013-10-26 NOTE — Progress Notes (Signed)
Primary care physician: Dr. Gustavus Messing, MD at [facility] primary care in [place].   HPI  This is a 68 year old man who is here today for a followup visit regarding chronic systolic heart failure with severely reduced LV systolic function.  He had  history of cardiomyopathy in the past with reported ejection fraction of 35% in 2004 which was not treated. He has known history of severe COPD and prolonged tobacco use. He was hospitalized at [facility] in January for congestive heart failure . Echocardiogram showed an ejection fraction of 15-20% with mild mitral regurgitation and calcified aortic valve without significant stenosis. He was started on heart failure medications. Dyspnea improved with diuresis and treating his COPD. He developed significant hyponatremia with changes in his mental status. This subsequently improved. He underwent a right and left cardiac catheterization which showed single-vessel left circumflex coronary artery disease which did not explain his degree of cardiomyopathy. No intervention was performed.  He was rehospitalized again on February 5 th with acute on chronic heart failure with hyponatremia. He was treated with Tolvaptan .   He has been doing reasonably well. 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 He had a syncopal episode about 2 weeks ago when he was trying to stand up quickly. He continues to have symptoms of orthostatic dizziness. Dyspnea improved. No chest pain.  No Known Allergies   Current Outpatient Prescriptions on File Prior to Visit  Medication Sig Dispense Refill  . albuterol (PROVENTIL HFA;VENTOLIN HFA) 108 (90 BASE) MCG/ACT inhaler Inhale 1 puff into the lungs 4 (four) times daily as needed for wheezing or shortness of breath.      Marland Kitchen aspirin 325 MG tablet Take 325 mg by mouth daily.      . carvedilol (COREG) 6.25 MG tablet Take 1 tablet (6.25 mg total) by mouth 2 (two) times daily with a meal.  60 tablet  3  . digoxin (LANOXIN) 0.125 MG tablet  Take 1 tablet (0.125 mg total) by mouth daily.  30 tablet  6  . ENSURE PLUS (ENSURE PLUS) LIQD Take 1 Can by mouth as needed.       . Fluticasone-Salmeterol (ADVAIR) 250-50 MCG/DOSE AEPB Inhale 1 puff into the lungs 2 (two) times daily.      . furosemide (LASIX) 20 MG tablet Take 20 mg by mouth 2 (two) times daily.      Marland Kitchen lisinopril (PRINIVIL,ZESTRIL) 5 MG tablet Take 5 mg by mouth daily.      . magnesium oxide (MAG-OX) 400 MG tablet Take 400 mg by mouth 2 (two) times daily.       . nitroGLYCERIN (NITROSTAT) 0.4 MG SL tablet Place 0.4 mg under the tongue every 5 (five) minutes as needed.       Marland Kitchen spironolactone (ALDACTONE) 25 MG tablet Take 1 tablet (25 mg total) by mouth daily.  90 tablet  3  . tiotropium (SPIRIVA) 18 MCG inhalation capsule Place 18 mcg into inhaler and inhale daily.       No current facility-administered medications on file prior to visit.     Past Medical History  Diagnosis Date  . Cardiomyopathy   . COPD (chronic obstructive pulmonary disease)   . CHF (congestive heart failure)   . MI (myocardial infarction)   . Syncope and collapse   . Chronic systolic heart failure     Most recent ejection fraction of 15-20% in January of 2015. Previous ejection fraction of 35% in 2004  . Coronary artery disease  Cardiac catheterization in January of 2015 showed severe one-vessel coronary artery disease with heavily calcified left circumflex and OM 1. Cardiomyopathy is out of proportion to coronary artery disease.  Marland Kitchen. Hyperlipidemia   . Hypertension      Past Surgical History  Procedure Laterality Date  . Cardiac catheterization  1/15    [facility]     Family History  Problem Relation Age of Onset  . Heart disease Father   . Emphysema Father   . Heart attack Father      History   Social History  . Marital Status: Married    Spouse Name: N/A    Number of Children: N/A  . Years of Education: N/A   Occupational History  . Not on file.   Social History Main Topics   . Smoking status: Never Smoker   . Smokeless tobacco: Not on file  . Alcohol Use: No  . Drug Use: No  . Sexual Activity: Not on file   Other Topics Concern  . Not on file   Social History Narrative  . No narrative on file      PHYSICAL EXAM   BP 118/75  Pulse 78  Ht 5\' 7"  (1.702 m)  Wt 118 lb (53.524 kg)  BMI 18.48 kg/m2   Constitutional: He is oriented to person, place, and time. He appears cachectic. No distress.  HENT: No nasal discharge.  Head: Normocephalic and atraumatic.  Eyes: Pupils are equal and round.  No discharge. Neck: Normal range of motion. Neck supple. Mild JVD present. No thyromegaly present.  Cardiovascular: Normal rate, regular rhythm, normal heart sounds. Exam reveals no gallop and no friction rub. No murmur heard.  Pulmonary/Chest: Effort normal and diminished breath sounds. No stridor. No respiratory distress. He has no wheezes. He has no rales. He exhibits no tenderness.  Abdominal: Soft. Bowel sounds are normal. He exhibits no distension. There is no tenderness. There is no rebound and no guarding.  Musculoskeletal: Normal range of motion. He exhibits mild edema and no tenderness.  Neurological: He is alert and oriented to person, place, and time. Coordination normal.  Skin: Skin is warm and dry. No rash noted. He is not diaphoretic. No erythema. No pallor.  Psychiatric: He has a normal mood and affect. His behavior is normal. Judgment and thought content normal.       ASSESSMENT AND PLAN

## 2013-10-26 NOTE — Assessment & Plan Note (Signed)
He seems to be improving gradually. He appears to be euvolemic on current medications. He reports having labs done with his primary care physician within the last 2 weeks. He is currently on optimal medications. Symptoms improved after the addition of digoxin. Continue same medications with no changes today.

## 2013-10-26 NOTE — Assessment & Plan Note (Signed)
He has no symptoms of angina. Continue medical therapy. 

## 2013-10-26 NOTE — Patient Instructions (Signed)
Continue same medications.   Avoid sudden standing up.   Follow up in 3 months.

## 2013-10-26 NOTE — Assessment & Plan Note (Signed)
I suspect that it was likely due to orthostatic hypotension. However, arrhythmia cannot be completely excluded given his degree of cardiomyopathy. I don't think he is a good candidate for an ICD placement at this time given his significant cachexia. The plan is to repeat echocardiogram in few months and to evaluate LV ejection fraction and determine if that's needed.

## 2014-01-16 ENCOUNTER — Other Ambulatory Visit: Payer: Self-pay | Admitting: Cardiovascular Disease

## 2014-01-28 ENCOUNTER — Encounter: Payer: Self-pay | Admitting: Cardiovascular Disease

## 2014-01-28 ENCOUNTER — Ambulatory Visit (INDEPENDENT_AMBULATORY_CARE_PROVIDER_SITE_OTHER): Payer: Medicare Other | Admitting: Cardiovascular Disease

## 2014-01-28 VITALS — BP 126/70 | HR 66 | Ht 68.0 in | Wt 120.0 lb

## 2014-01-28 DIAGNOSIS — I209 Angina pectoris, unspecified: Secondary | ICD-10-CM

## 2014-01-28 DIAGNOSIS — I10 Essential (primary) hypertension: Secondary | ICD-10-CM

## 2014-01-28 DIAGNOSIS — I5022 Chronic systolic (congestive) heart failure: Secondary | ICD-10-CM

## 2014-01-28 DIAGNOSIS — I25119 Atherosclerotic heart disease of native coronary artery with unspecified angina pectoris: Secondary | ICD-10-CM

## 2014-01-28 DIAGNOSIS — I2510 Atherosclerotic heart disease of native coronary artery without angina pectoris: Secondary | ICD-10-CM

## 2014-01-28 DIAGNOSIS — E785 Hyperlipidemia, unspecified: Secondary | ICD-10-CM

## 2014-01-28 NOTE — Patient Instructions (Signed)
Your physician wants you to follow-up in: 4 months. You will receive a reminder letter in the mail two months in advance. If you don't receive a letter, please call our office to schedule the follow-up appointment.  

## 2014-01-28 NOTE — Assessment & Plan Note (Signed)
He has no symptoms of angina. Continue medical therapy. 

## 2014-01-28 NOTE — Assessment & Plan Note (Signed)
I agree with treatment with a statin with a target LDL of less than 70.

## 2014-01-28 NOTE — Progress Notes (Signed)
Primary care physician: Dr. Gustavus MessingVicki Fowler Ingledue, MD at [facility] in [place].   HPI  This is a 68 year old man who is here today for a followup visit regarding chronic systolic heart failure with severely reduced LV systolic function.  He had  history of cardiomyopathy in the past with reported ejection fraction of 35% in 2004 which was not treated. He has known history of severe COPD and prolonged tobacco use. He was hospitalized at [facility] in January for congestive heart failure . Echocardiogram showed an ejection fraction of 15-20% with mild mitral regurgitation and calcified aortic valve without significant stenosis. He was started on heart failure medications. Dyspnea improved with diuresis and treating his COPD. He developed significant hyponatremia with changes in his mental status. This subsequently improved. He underwent a right and left cardiac catheterization which showed single-vessel left circumflex coronary artery disease which did not explain his degree of cardiomyopathy. No intervention was performed.  He was rehospitalized again on February 5 th with acute on chronic heart failure with hyponatremia. He was treated with Tolvaptan .   He has been doing reasonably well. During last visit, he was started on small dose digoxin. Spironolactone was discontinued by his primary care physician due to hyperkalemia. Overall, he has been feeling better with less dyspnea.    No Known Allergies   Current Outpatient Prescriptions on File Prior to Visit  Medication Sig Dispense Refill  . albuterol (PROVENTIL HFA;VENTOLIN HFA) 108 (90 BASE) MCG/ACT inhaler Inhale 1 puff into the lungs 4 (four) times daily as needed for wheezing or shortness of breath.      Marland Kitchen. aspirin 325 MG tablet Take 325 mg by mouth daily.      . carvedilol (COREG) 6.25 MG tablet TAKE 1 TABLET (6.25 MG TOTAL) BY MOUTH 2 (TWO) TIMES DAILY WITH A MEAL.  60 tablet  3  . digoxin (LANOXIN) 0.125 MG tablet Take 1 tablet (0.125  mg total) by mouth daily.  30 tablet  6  . Fluticasone-Salmeterol (ADVAIR) 250-50 MCG/DOSE AEPB Inhale 1 puff into the lungs 2 (two) times daily.      . furosemide (LASIX) 20 MG tablet Take 20 mg by mouth 2 (two) times daily.      Marland Kitchen. lisinopril (PRINIVIL,ZESTRIL) 5 MG tablet Take 5 mg by mouth daily.      . magnesium oxide (MAG-OX) 400 MG tablet Take 400 mg by mouth daily.       . nitroGLYCERIN (NITROSTAT) 0.4 MG SL tablet Place 0.4 mg under the tongue every 5 (five) minutes as needed.        No current facility-administered medications on file prior to visit.     Past Medical History  Diagnosis Date  . Cardiomyopathy   . COPD (chronic obstructive pulmonary disease)   . CHF (congestive heart failure)   . MI (myocardial infarction)   . Syncope and collapse   . Chronic systolic heart failure     Most recent ejection fraction of 15-20% in January of 2015. Previous ejection fraction of 35% in 2004  . Coronary artery disease     Cardiac catheterization in January of 2015 showed severe one-vessel coronary artery disease with heavily calcified left circumflex and OM 1. Cardiomyopathy is out of proportion to coronary artery disease.  Marland Kitchen. Hyperlipidemia   . Hypertension      Past Surgical History  Procedure Laterality Date  . Cardiac catheterization  1/15    [facility]     Family History  Problem Relation  Age of Onset  . Heart disease Father   . Emphysema Father   . Heart attack Father      History   Social History  . Marital Status: Married    Spouse Name: N/A    Number of Children: N/A  . Years of Education: N/A   Occupational History  . Not on file.   Social History Main Topics  . Smoking status: Never Smoker   . Smokeless tobacco: Not on file  . Alcohol Use: No  . Drug Use: No  . Sexual Activity: Not on file   Other Topics Concern  . Not on file   Social History Narrative  . No narrative on file      PHYSICAL EXAM   BP 126/70  Pulse 66  Ht 5\' 8"  (1.727 m)   Wt 120 lb (54.432 kg)  BMI 18.25 kg/m2   Constitutional: He is oriented to person, place, and time. He appears cachectic. No distress.  HENT: No nasal discharge.  Head: Normocephalic and atraumatic.  Eyes: Pupils are equal and round.  No discharge. Neck: Normal range of motion. Neck supple. Mild JVD present. No thyromegaly present.  Cardiovascular: Normal rate, regular rhythm, normal heart sounds. Exam reveals no gallop and no friction rub. No murmur heard.  Pulmonary/Chest: Effort normal and diminished breath sounds. No stridor. No respiratory distress. He has no wheezes. He has no rales. He exhibits no tenderness.  Abdominal: Soft. Bowel sounds are normal. He exhibits no distension. There is no tenderness. There is no rebound and no guarding.  Musculoskeletal: Normal range of motion. He exhibits mild edema and no tenderness.  Neurological: He is alert and oriented to person, place, and time. Coordination normal.  Skin: Skin is warm and dry. No rash noted. He is not diaphoretic. No erythema. No pallor.  Psychiatric: He has a normal mood and affect. His behavior is normal. Judgment and thought content normal.       ASSESSMENT AND PLAN

## 2014-01-28 NOTE — Assessment & Plan Note (Signed)
Blood pressure is optimal on current medications.

## 2014-01-28 NOTE — Assessment & Plan Note (Signed)
He seems to be doing significantly better since most recent visit. He has no signs of fluid overload. He did not tolerate spironolactone due to hyperkalemia. I recommend continuing current medications with no changes. We again discussed the indication for an ICD placement. Given his comorbidities, he wants to hold off for now which I think is reasonable.

## 2014-04-16 ENCOUNTER — Other Ambulatory Visit: Payer: Self-pay | Admitting: *Deleted

## 2014-04-16 MED ORDER — DIGOXIN 125 MCG PO TABS: 0.1250 mg | ORAL_TABLET | Freq: Every day | ORAL | Status: AC

## 2014-04-16 NOTE — Telephone Encounter (Signed)
Requested Prescriptions   Signed Prescriptions Disp Refills  . digoxin (LANOXIN) 0.125 MG tablet 30 tablet 6    Sig: Take 1 tablet (0.125 mg total) by mouth daily.    Authorizing Provider: Lorine Bears A    Ordering User: Kendrick Fries

## 2014-05-16 ENCOUNTER — Other Ambulatory Visit: Payer: Self-pay | Admitting: Cardiovascular Disease

## 2014-09-16 ENCOUNTER — Telehealth: Payer: Self-pay

## 2014-09-16 NOTE — Telephone Encounter (Signed)
-----   Message from Iran Ouch, MD sent at 09/11/2014 11:49 AM EST ----- This patient is overdue for his follow-up. Not sure why he has not been scheduled.

## 2014-09-16 NOTE — Telephone Encounter (Signed)
l mom to return call to schedule recall

## 2014-11-13 NOTE — Consult Note (Signed)
General Aspect Raymond Carpenter is a 69yo gentleman w/ PMHx s/f unspecified cardiomyopathy, HTN, ongoing tobacco abuse, emphysema and h/o HLD who was admitted to [facility] yesterday for decompensated HF.   His wife is at the bedside and supplements the history. He has not seen any doctor in several years. He has smoked 1-1.5 PPD for most of his life. He had a history of EtOH abuse 6-8 beers/day. Occasional use currently. He had previously been seen by Dr. Gwen Carpenter, stress test 8-10 years ago was "normal." No follow-up in several years. There is mention of a prior 2004 echo- EF 35% at that time on the H&P.  He reports experiencing intermittent chest discomfort around Christmas time. Since then, he reports increasing LE edema, orthopnea, PND, cough productive of white sputum and DOE progressing to resting dyspnea. He has had a gradual, unintentional weight loss for the past year. No abnormal bleeding, fevers, chills. Wife cooks meals- no increased salt/fluid intake. He denies palpitations or syncope. His symptoms progressed, and at the urging of his wife, presented to [facility] ED.   Present Illness There,  EKG revealed a NSR w/ frequeny ventricular/atrial ectopy, poor R wave progression, inferior Q waves. Initial TnI returned at 0.06, susequent TnI 0.09. BNP 30K. Albumin 2.8. Na 127. Labwork otherwise unremarkable. CXR- cardiomegaly with bibasilar airspace opacities and bilateral pleural effusions. Normotensive in the ED. He was admitted and diuresed.  Total I/O -2600 mL. Breathing and LE edema improved. LDL 59.   PAST MEDICAL HISTORY Cardiomyopathy Hypertension Ongoing tobacco abuse Emphysema  PAST SURGICAL HISTORY Vasectomy 10-20 years ago  ALLERGIES No known drug allergies  MEDICATIONS None  SOCIAL HISTORY Significant EtOH abuse in the past (6-8 beers/day), occasional EtOH use currently. 1-1.5 PPD for "most of my life." No illicit drug use. Lives in [place], [place]; married, 1 son (deceased), 1  daughter.  FAMILY HISTORY Mother with dementia. Father with emphysema. No 1st degree relatives with cardiac history.   Physical Exam:  GEN no acute distress, cachectic   HEENT pink conjunctivae, PERRL, hearing intact to voice, poor dentition   NECK No masses  trachea midline  prominent strap muscles, no appreciable JVD or bruits   RESP normal resp effort  no use of accessory muscles  diffusely diminished breath sounds, no wheezing, rales or rhonchi   CARD Tachycardic  Normal, S1, S2  frequent extra-systoles, no murmurs, rubs, gallops, heaves or thrills   ABD denies tenderness  soft  normal BS   EXTR negative cyanosis/clubbing, 1+ bilateral pretibial pitting edema   SKIN normal to palpation, skin turgor good   NEURO follows commands, motor/sensory function intact   PSYCH alert, A+O to time, place, person   Review of Systems:  Subjective/Chief Complaint shortness of breath   General: Weight loss or gain  Fatigue  Weakness   Skin: Dryness   Respiratory: Frequent cough  Short of breath  Sputum   Cardiovascular: Chest pain or discomfort  Tightness  Dyspnea  Orthopnea  Edema   Review of Systems: All other systems were reviewed and found to be negative   Lab Results:  Hepatic:  14-Jan-15 17:48   Albumin, Serum  2.8  Routine Chem:  14-Jan-15 17:48   Sodium, Serum  127  B-Type Natriuretic Peptide [facility])  [phone] (Result(s) reported on 05 Aug 2013 at 06:52PM.)  Cardiac:  14-Jan-15 17:48   Troponin I  0.06 (0.00-0.05 0.05 ng/mL or less: NEGATIVE  Repeat testing in 3-6 hrs  if clinically indicated. >0.05 ng/mL: POTENTIAL  MYOCARDIAL INJURY.  Repeat  testing in 3-6 hrs if  clinically indicated. NOTE: An increase or decrease  of 30% or more on serial  testing suggests a  clinically important change)  CK, Total 183  CPK-MB, Serum  6.4 (Result(s) reported on 05 Aug 2013 at 06:52PM.)  15-Jan-15 01:57   Troponin I  0.09 (0.00-0.05 0.05 ng/mL or less: NEGATIVE  Repeat  testing in 3-6 hrs  if clinically indicated. >0.05 ng/mL: POTENTIAL  MYOCARDIAL INJURY. Repeat  testing in 3-6 hrs if  clinically indicated. NOTE: An increase or decrease  of 30% or more on serial  testing suggests a  clinically important change)   EKG:  Interpretation sinus tachycardia, frequent PVC/PACs, Q waves V1, V2 (vs poor R wave progression), aVF, possibly III aVF, LAE, LAD, TWIs V4-V6, I, aVL   Rate 113   EKG Comparision no prior for comparison   Radiology Results:  XRay:    14-Jan-15 18:15, Chest PA and Lateral  Chest PA and Lateral   REASON FOR EXAM:    sob  COMMENTS:       PROCEDURE: DXR - DXR CHEST PA (OR AP) AND LATERAL  - Aug 05 2013  6:15PM     CLINICAL DATA:  Cough, shortness of Breath    EXAM:  CHEST - 2 VIEW    COMPARISON:  None available    FINDINGS:  Bilateral pleural effusions, possibly loculated laterally on the  left. Coarse airspace opacities in both lung bases. Moderate  cardiomegaly. Atheromatous aorta. Mild compression fracture  deformity in the lower thoracic spine. .     IMPRESSION:  Cardiomegaly with bibasilar airspace opacities and bilateral pleural  effusions.      Electronically Signed    By: Oley Balm M.D.    On: 08/05/2013 18:19         Verified By: Philis Fendt, M.D.,  Cardiology:    15-Jan-15 07:45, Echo Doppler  Echo Doppler   REASON FOR EXAM:      COMMENTS:       PROCEDURE: [facility] - ECHO DOPPLER COMPLETE(TRANSTHOR)  - Aug 06 2013  7:45AM     RESULT: Echocardiogram Report    Patient Name:   Raymond Carpenter Date of Exam: 08/06/2013  Medical Rec #:  [id number]            Custom1:  Date of Birth:  [birth year]          Height:       68.9 in  Patient Age:    68 years          Weight:       130.1 lb  Patient Gender: M                 BSA:          1.72 m??    Indications: CHF  Sonographer:    Cristela Blue RDCS  Referring Phys: Hilda Lias, J    Sonographer Comments: Technically difficult study due to poor echo    windows.    Summary:   1. Left ventricular ejection fraction, by visual estimation, is 15-20%%.   2. Severely decreased global left ventricular systolic function.   3. Mildly dilated left atrium.   4. Mild mitral valve regurgitation.   5. Mildly increased left ventricular internal cavity size.   6. Mild to moderate aortic valve sclerosis/calcification without any   evidence of aortic stenosis.  2D AND M-MODE MEASUREMENTS (normal ranges within parentheses):  Left Ventricle:  Normal  IVSd (2D):      0.84 cm (0.7-1.1)  LVPWd (2D):     0.98 cm (0.7-1.1) Aorta/LA:                  Normal  LVIDd (2D):     5.12 cm (3.4-5.7) Aortic Root (2D): 2.90 cm (2.4-3.7)  LVIDs (2D):     4.73 cm           Left Atrium (2D): 3.30 cm (1.9-4.0)  LV FS (2D):      7.6 %   (>25%)  LV EF (2D):     16.8 %   (>50%)                                    Right Ventricle:                                    RVd (2D):        2.81 cm  LV DIASTOLIC FUNCTION:  MV Peak E: 0.87 m/s E/e' Ratio: 10.20                      Decel Time: 102 msec  SPECTRAL DOPPLER ANALYSIS (where applicable):  Mitral Valve:  MV P1/2 Time: 29.58 msec  MV Area, PHT: 7.44 cm??  Aortic Valve: AoV Max Vel: 0.90 m/s AoV Peak PG: 3.2 mmHg AoV Mean PG:  LVOT Vmax: 0.54 m/s LVOT VTI:  LVOT Diameter: 2.10 cm  AoV Area, Vmax: 2.08 cm?? AoV Area, VTI:  AoV Area, Vmn:  Tricuspid Valve and PA/RV Systolic Pressure: TR Max Velocity: 2.21 m/s RA   Pressure: 5 mmHg RVSP/PASP: 24.5 mmHg  Pulmonic Valve:  PV Max Velocity: 0.76 m/s PV Max PG: 2.3 mmHg PV Mean PG:    PHYSICIAN INTERPRETATION:  Left Ventricle: The left ventricular internal cavity size was mildly   increased. LV posterior wall thickness was normal. Global LV systolic   function was severely decreased. Left ventricular ejection fraction, by   visual estimation, is 15-20%%.  Right Ventricle: The right ventricular size is mildly enlarged. Global RV   systolic function is mildly  reduced.  Left Atrium: The left atrium is mildly dilated.  Right Atrium: The right atrium is normal in size.  Pericardium: There is no evidence of pericardial effusion.  Mitral Valve: The mitral valve is not well seen. No evidence of mitral   valve stenosis. Mild mitral valve regurgitation is seen.  Tricuspid Valve: The tricuspid valve is not well seen. Trivial tricuspid   regurgitation is visualized. The tricuspid regurgitant velocity is 2.21   m/s, and with an assumed right atrial pressure of 5 mmHg, the estimated   right ventricular systolic pressure is normal at 24.5 mmHg.  Aortic Valve: The aortic valve was not well seen. Mild to moderate aortic   valve sclerosis/calcification is present, without any evidence of aortic   stenosis. No evidence of aortic valve regurgitationis seen.  Pulmonic Valve: The pulmonic valve is not well seen.  Aorta: The aorta is not well seen.  Venous: The inferior vena cava was not well visualized.  [id number] Lorine Bears MD  Electronically signed by [id number] Lorine Bears MD  Signature Date/Time: 08/06/2013/9:32:46 AM    *** Final ***    IMPRESSION: .        Verified By: Chelsea Aus. ARIDA,  M.D., MD    No Known Allergies:   Vital Signs/Nurse's Notes: **Vital Signs.:   15-Jan-15 06:10  Vital Signs Type Routine  Temperature Temperature (F) 98.2  Celsius 36.7  Temperature Source oral  Pulse Pulse 112  Respirations Respirations 18  Systolic BP Systolic BP 118  Diastolic BP (mmHg) Diastolic BP (mmHg) 74  Mean BP 88  Pulse Ox % Pulse Ox % 92  Pulse Ox Activity Level  At rest  Oxygen Delivery Room Air/ 21 %    Impression 68yo gentleman w/ PMHx s/f unspecified cardiomyopathy, HTN, ongoing tobacco abuse, emphysema and h/o HLD who was admitted to [facility] yesterday for decompensated HF.  1. Acute on chronicsuspected systolic CHF Patient presents with progressive LE edema, worsening orthopnea, PND, cough productive of white sputum and DOE->resting dyspnea.  He did have intermittent cp late last month. H/o unspecified cardiomyopathy previously followed by Dr. Gwen Carpenter. He has not followed a cardiologist in several years. No prior cardiac cath, denies prior dx of MI.  Objectively, TnI with mild bump/plateau. EKG with inferior, +/- anterior Q waves, lateral TWIs. Echo performed this AM showed an EF of 15-20% with mild MR.  -- Low-dose ASA, ACEi, BB, high potency statin, NTG SL PRN -- He is tolerating carvedilol  -- Add TED hose (continue post-discharge), CHF education -- Could transition to PO Lasix today  2. Emphysemia w/ ongoing tobacco abuse -- Tobacco cessation stressed -- NRT as a means of cessation assistance (patch taper) -- Management per primary team -- Establish w/ PCP  3. Cachexia, unintentional weight loss Emphysematous body habitus. Reduced appetite recently. Has missed cancer screenings as well. -- Nutritionist consult -- Establish w/ PCP for health maintenance, screening  4. Hypertension Normotensive.  -- Continue current antihypertensives  5. Arrhyhtmia The patient exhibits episodes of transient  PAT, HR 150s. Intermittent NSVT. Currently NSR. -- Titrate rate-control as tolerated   Plan Attending physician note: The patient was seen examined. The above note was modified by me. He has severe cardiomyopathy.  Can switch to po Lasix tomorrow.  Recommend a right and left cardiac cath which can likely be done as an outpatient once overall condition is improved. Overall, poor prognosis.   Electronic Signatures: Gery Pray (PA-C)  (Signed 15-Jan-15 17:46)  Authored: General Aspect/Present Illness, History and Physical Exam, Review of System, Home Medications, Labs, EKG , Radiology, Allergies, Vital Signs/Nurse's Notes, Impression/Plan Lorine Bears (MD)  (Signed 15-Jan-15 18:28)  Authored: Radiology, Impression/Plan  Co-Signer: General Aspect/Present Illness, History and Physical Exam, Review of System, Home  Medications, Labs, EKG , Radiology, Allergies, Vital Signs/Nurse's Notes, Impression/Plan   Last Updated: 15-Jan-15 18:28 by Lorine Bears (MD)

## 2014-11-13 NOTE — H&P (Signed)
PATIENT NAME:  Raymond Carpenter, Raymond Carpenter MR#:  [id number] DATE OF BIRTH:  September 03, 1945  DATE OF ADMISSION:  08/05/2013  PRIMARY CARE PHYSICIAN: Does not have one.   CHIEF COMPLAINT: Shortness of breath and lower extremity edema.   HISTORY OF PRESENT ILLNESS: This is a 69 year old male who presents to the hospital due to worsening shortness of breath and lower extremity edema. The patient apparently does have a history of cardiomyopathy and also hypertension, but has not seen a doctor in over 5 to 6 years. As per the wife, the patient has been having significant shortness of breath, even on minimal exertion. She also has noticed that he has been more winded not even with any exertion at rest. He also has a cough, which is productive and which is clear sputum, but no fevers, no nausea, no vomiting. He also had an episode of chest pain a few weeks back around Christmastime, which lasted only a few minutes and then resolved. He denies having any further evidence of chest pain. He also has noticed that he has had significant lower extremity edema, to a point where his legs have been weeping clear fluid. He also cannot sleep anymore flat in his bed and has to sleep in a recliner. The patient presented to the hospital with these symptoms and was noted to be in acute congestive heart failure. Hospitalist services were contacted for further treatment and evaluation.   REVIEW OF SYSTEMS: CONSTITUTIONAL: No documented fever. No weight gain. No weight loss.  EYES: No blurred or double vision.  EARS, NOSE, THROAT: No tinnitus. No postnasal drip. No redness of the oropharynx.  RESPIRATORY: Positive cough. No wheeze. No hemoptysis. Positive dyspnea. Positive COPD.  CARDIOVASCULAR: No chest pain. Positive orthopnea. No paroxysmal nocturnal dyspnea. No palpitations. No syncope.  GASTROINTESTINAL: No nausea. No vomiting. No diarrhea. No abdominal pain. No melena. No hematochezia.  GENITOURINARY: No dysuria. No hematuria.   ENDOCRINE: No polyuria, nocturia, heat or cold intolerance.  HEMATOLOGIC: No anemia. No bruising. No bleeding.  INTEGUMENTARY: No rashes. No lesions.  MUSCULOSKELETAL: No arthritis, no swelling, no gout.  NEUROLOGIC: No numbness. No tingling. No ataxia. No seizure-type activity.  PSYCHIATRIC: No anxiety. No insomnia. No ADD.   PAST MEDICAL HISTORY: Consistent with history of cardiomyopathy, ejection fraction of 35%, COPD with ongoing tobacco abuse, hypertension.   ALLERGIES: No known drug allergies.   SOCIAL HISTORY: Does smoke about a pack per day, has been smoking for the past 40+ years. No alcohol abuse. No illicit drug abuse. Lives at home with his wife.   FAMILY HISTORY: Both mother and father are deceased. The patient's mother died from a suicide attempt. Father died from emphysema and heart disease.   CURRENT MEDICATIONS: The patient is currently taking no medications.   PHYSICAL EXAMINATION: Presently is as follows: VITAL SIGNS: Temperature is 98.7, pulse 109, respirations 20, blood pressure 120/82, sats 98% on 2 liters nasal cannula.  GENERAL: He is a pleasant-appearing male, but in no apparent distress.  HEENT: Atraumatic, normocephalic. Extraocular muscles are intact. Pupils equal and reactive to light. Sclerae anicteric. No conjunctival injection. No pharyngeal erythema.  NECK: Supple. There is no jugular venous distention. No bruits, no lymphadenopathy, no thyromegaly.  HEART: Regular rate and rhythm. No murmurs, no rubs, no clicks.  LUNGS: He has some prolonged inspiratory and expiratory phase. Bibasilar crackles. Negative use of accessory muscles. No dullness to percussion.  ABDOMEN: Soft, flat, nontender, nondistended. Has good bowel sounds. No hepatosplenomegaly appreciated.  EXTREMITIES: No evidence of any  cyanosis, clubbing. He does have +2 pitting edema from the knees to the ankles bilaterally.  SKIN: Moist and warm with no rashes.  LYMPHATIC: There is no cervical or  axillary lymphadenopathy.   LABORATORY DATA: Serum glucose of 137. BNP of 30,491. BUN 15, creatinine 0.8, sodium 127, potassium 4.4, chloride 92, bicarbonate 33. The patient's LFTs are within normal limits. Troponin 0.06. White cell count 9.9, hemoglobin 13.8, hematocrit 42.1, platelet count of 407.   The patient did have an EKG done, which showed normal sinus rhythm with normal axis with LVH by voltage criteria and T wave inversions in the lateral leads.   ASSESSMENT AND PLAN: This is a 69 year old male with a history of cardiomyopathy, ejection fraction of 35%, ongoing tobacco abuse, hypertension, presents to the hospital due to worsening shortness of breath, lower extremity edema, and noted to be in acute congestive heart failure.  1.  Acute congestive heart failure: This is likely the cause of the patient's shortness of breath and worsening lower extremity edema. This is likely acute on chronic systolic congestive heart failure. The patient has not seen a doctor in many years. He did have an echocardiogram done in 2004, which showed ejection fraction of 35%. For now, I will diurese him with IV Lasix, follow ins and outs and daily weights. I will repeat a 2-dimensional echocardiogram. I will start him on low-dose Coreg, also low-dose ACE inhibitor, and get a cardiology consult.  2.  Elevated troponin: This is likely in the setting of demand ischemia from congestive heart failure. No evidence of acute coronary syndrome. I will start him on baby aspirin, keep him on telemetry, follow serial cardiac markers, get a cardiology consult, get a 2-dimensional echocardiogram. I will check a lipid panel in case he needs to be on a statin.  3.  Chronic obstructive pulmonary disease with ongoing tobacco abuse: The patient does not seem  to be in acute chronic obstructive pulmonary disease exacerbation. I will start him on some Advair and Spiriva and some p.r.n. DuoNebs. He likely will need to be assessed for home  oxygen prior to discharge.  4.  Hyponatremia: This is likely hypovolemic hyponatremia related to congestive heart failure. I will follow his sodium with diuresis.  5.  The patient is a full code.   TIME SPENT ON ADMISSION: 50 minutes.    ____________________________ Rolly Pancake. Cherlynn Kaiser, MD vjs:jcm D: 08/05/2013 20:33:18 ET T: 08/05/2013 21:05:19 ET JOB#: [id number]  cc: Rolly Pancake. Cherlynn Kaiser, MD, <Dictator> Houston Siren MD ELECTRONICALLY SIGNED 08/19/2013 11:29

## 2014-11-13 NOTE — H&P (Signed)
PATIENT NAME:  Raymond Carpenter, Raymond Carpenter MR#:  [id number] DATE OF BIRTH:  [birth year]  DATE OF ADMISSION:  08/27/2013  PRIMARY CARE PHYSICIAN: [facility]  CHIEF COMPLAINT: Shortness of breath and lower extremity edema.   HISTORY OF PRESENT ILLNESS: This is a 69 year old male who presents to the hospital with worsening lower extremity edema and shortness of breath after being referred from his primary care physician's office. The patient went to see his primary care physician today, who did some ambulatory sats on the patient, and he was noted to have O2 sats on ambulation in the 70s. He came to the ER here, and also noted to have ambulatory sats in the 80s. The patient admits to a cough, which is productive with brown sputum. He denies any paroxysmal nocturnal dyspnea, orthopnea, any palpitations or any syncope. He denies any chest pain. The patient was noted to be in decompensated congestive heart failure, and hospitalist services were contacted for further treatment and evaluation.   REVIEW OF SYSTEMS:   CONSTITUTIONAL: No documented fever. Positive weight gain, about 4 to 5 pounds.  EYES: No blurry or double vision.  EARS, NOSE, THROAT: No tinnitus. No postnasal drip. No redness of the oropharynx.  RESPIRATORY: Positive cough. No wheeze. No hemoptysis. Positive dyspnea.  CARDIOVASCULAR: No chest pain, no orthopnea, no palpitations, no syncope.  GASTROINTESTINAL: No nausea, no vomiting, no diarrhea, no abdominal pain, no melena or hematochezia.  GENITOURINARY: No dysuria or hematuria. ENDOCRINE: No polyuria or nocturia, heat or cold intolerance.  HEMATOLOGIC: No anemia, no bruising, no bleeding.  INTEGUMENT: No rashes. No lesions.  MUSCULOSKELETAL: No arthritis. No swelling. No gout.  NEUROLOGIC: No numbness or tingling. No ataxia. No seizure-type activity.  PSYCHIATRIC: No anxiety, no insomnia, no ADD.   PAST MEDICAL HISTORY: Consistent with cardiomyopathy, ejection fraction of 15% to 20%,  COPD, end-stage, single vessel coronary artery disease, history of systolic congestive heart failure.   ALLERGIES: No known drug allergies.   SOCIAL HISTORY: Does have a long tobacco abuse history, about 40+ years. No alcohol abuse. No illicit drug abuse. Lives at home with his wife.   FAMILY HISTORY: Both mother and father are deceased. The patient's mother died from a suicide attempt. Father died from emphysema and heart disease.   CURRENT MEDICATIONS ARE AS FOLLOWS:  Advair 250/50, 1 puff b.i.d., aspirin 325 mg daily, Coreg 6.25 mg b.i.d., Lasix 20 mg 2 tabs daily, lisinopril 5 mg daily, magnesium oxide 400 mg daily, sublingual nitroglycerin q. 5 minutes as needed for chest pain, Spiriva 1 puff daily, trazodone 50 mg at bedtime, and albuterol inhaler 2 puffs 4 times daily as needed.   PHYSICAL EXAMINATION PRESENTLY IS AS FOLLOWS:  VITAL SIGNS ARE NOTED TO BE: Temperature 97.5, pulse 122, respirations 16, blood pressure 114/79, sats 94% on 2 liters nasal cannula.  GENERAL: He is a pleasant-appearing male in no apparent distress.  HEAD, EYES, EARS, NOSE, THROAT EXAM: Atraumatic, normocephalic. Extraocular muscles are intact. Pupils are equal and react to light. Sclerae are anicteric. No conjunctival injection. No pharyngeal erythema.  NECK: Supple. No jugular venous distention. No bruits. No lymphadenopathy or thyromegaly.  HEART EXAM: Regular rate and rhythm. No murmurs, no rubs, no clicks.  LUNGS: He has some coarse crackles at the bases. Prolonged inspiratory and expiratory phase. Negative use of accessory muscles. No dullness to percussion.  ABDOMEN: Soft, flat, nontender, nondistended. Good bowel sounds. No hepatosplenomegaly appreciated.  EXTREMITIES: No evidence of any cyanosis or clubbing, +1 pitting edema from the knees  to ankles bilaterally, +2 pedal and radial pulses bilaterally.  NEUROLOGICAL: The patient is alert, awake, oriented x 3, with no focal motor or sensory deficits  appreciated bilaterally.  SKIN EXAM: Moist and warm, with no rashes appreciated.  LYMPHATIC: There is no cervical or axillary lymphadenopathy.   LABORATORY DATA: Showed a serum glucose of 82, BUN 12, creatinine 0.7, sodium 130, potassium 2.7, chloride 86, bicarb 39. Troponin 0.04. White cell count 7.7, hemoglobin 12.8, hematocrit 38.6, platelet count 277. The patient did have a chest x-ray done, which showed persistent bibasilar airspace opacities, slightly less conspicuous on the right, stable right pleural effusion, pleural thickening versus small effusion. On the left appears stable. No new focal regions of consolidation. Interstitial findings have decreased possibly representing components of decreased pulmonary edema. Chronic fibrotic change is also noted.   ASSESSMENT AND PLAN: This is a 69 year old male with a history of chronic obstructive pulmonary disease, cardiomyopathy, ejection fraction of 15% to 20%, single-vessel coronary artery disease, history of systolic congestive heart failure, who was just recently discharged from the hospital 2 weeks ago, returns back due to shortness of breath and hypoxia.   1.  Acute on chronic respiratory failure. I suspect this is secondary to decompensated CHF. I will diurese the patient with IV Lasix, follow Is and Os, and daily weights. I will continue his Coreg, continue his lisinopril, and follow him clinically.   2.  Congestive heart failure. This is likely acute on chronic systolic dysfunction. I will diurese him with IV Lasix, follow Is and Os, and daily weights. Continue his Coreg. Continue his lisinopril. The patient likely needs to be assessed for home oxygen prior to discharge.   3.  Hypokalemia. I will go ahead and replace and repeat his potassium in the morning. This is probably related to his diuresis. I will check a magnesium level.   4. Chronic obstructive pulmonary disease. No evidence of any acute COPD exacerbation. Continue his Advair and  Spiriva. The patient needs to be assessed for home oxygen prior to discharge.   The patient is a FULL CODE.  Time spent is 50 minutes.    ____________________________ Rolly Pancake. Cherlynn Kaiser, MD vjs:mr D: 08/27/2013 19:13:54 ET T: 08/27/2013 19:48:39 ET JOB#: [id number]  cc: Rolly Pancake. Cherlynn Kaiser, MD, <Dictator> Houston Siren MD ELECTRONICALLY SIGNED 09/05/2013 18:00

## 2014-11-13 NOTE — Discharge Summary (Signed)
PATIENT NAME:  Raymond Carpenter, Raymond Carpenter MR#:  [id number] DATE OF BIRTH:  12-May-1946  DATE OF ADMISSION:  08/05/2013 DATE OF DISCHARGE:  08/14/2013   ADMITTING DIAGNOSIS: Short of breath, lower extremity edema.   DISCHARGE DIAGNOSES:  1. Acute respiratory failure due to acute systolic congestive heart failure with severe cardiomyopathy.  2. Elevated troponin, felt to be due to demand ischemia, status post cardiac catheterization, with severe single left circumflex stenosis, heavily calcified, medical treatment recommended.  3. Hyponatremia, felt to be due to congestive heart failure and fluid overload. Treated with conivaptan, with sodium level being much better.  4. Severe cardiomyopathy, felt to be due to out of proportional to his left circumflex disease by cardiology.  5. Nicotine addiction. The patient counseled regarding smoking cessation, 4 minutes spent.  6. Electrolyte imbalances, status post replacement.  7. Chronic obstructive pulmonary disease exacerbation.  8. Arrhythmia with intermittent non-supraventricular tachycardia paroxysmal atrial tachycardia,  improved with current treatment.  9. Severe caloric protein malnutrition.  10. Hypertension.   PERTINENT LABORATORIES AND EVALUATIONS: Admitting glucose 77, BUN 17, creatinine 0.88, sodium 130, potassium 2.9, chloride was 88, CO2 was 37, magnesium 1.4, calcium 8.0. Troponin was 0.10, 0.11 and then 0.11. Sodium dropped as low as 121 on January 18th. Most recent sodium today is 131. CT scan of the head without contrast showed no acute abnormality. Chest x-ray showed cardiomegaly with bibasilar airspace opacification and bilateral pleural effusions. Echocardiogram of the heart showed left ventricular EF 15% to 20%, severely decreased global left ventricular systolic function, mildly dilated left atrium, mild mitral valve regurg, mildly increased left ventricular internal cavitary size, mild to moderate aortic valve sclerosis, calcification.    HOSPITAL COURSE: Please refer to H and P done by the admitting physician. The patient is a 69 year old white male with history of previous cardiomyopathy, who presented with shortness of breath, lower extremity swelling. The patient was not on any medications. The patient presented with these symptoms and was diagnosed with acute systolic CHF. He was treated with diuresis, with worsening of his sodium level. Therefore, a nephrology consult was obtained. He was seen by Dr. Mady Haagensen and was treated with conivaptan, with much improvement in his sodium and his respiratory status. He diuresed very well. Due to his cardiomyopathy, he underwent a cardiac catheterization which showed a single left circumflex disease which was heavily calcified. Therefore, medical management was recommended. The patient also had some intermittent confusion and lethargy due to his hyponatremia, but now he is improved. The patient also developed wheezing and coughing during hospitalization. He was thought to have COPD and was treated for COPD exacerbation. This would be a new diagnosis for this patient. In terms of his respiratory status, it is much improved. He is off O2 now, and his breathing is better. At this point, he has been cleared by cardiology for discharge. He is stable for discharge. Discharge instructions for CHF given.   DISCHARGE MEDICATIONS:  1. Prednisone 5 mg 1 tab p.o. daily for 3 days.  2. Lisinopril 5 daily.  3. Carvedilol 6.25 one tab p.o. b.i.d. 4. Spiriva 18 mcg daily. 5. Advair 250/50 one puff b.i.d. 6. Nicotine 21 mg 1 patch daily for 6 weeks.  7. Mag oxide 400 mg 1 tab p.o. daily. 8. Ventolin 1 puff 4 times a day as needed for shortness of breath.  9. Lasix 20 mg 1 tab p.o. daily.  10. Nitroglycerin 0.4 sublingual p.r.n. chest pain.  11. Aspirin 325 daily.   DIET: Low sodium,  low fat diet.   ACTIVITY: As tolerated.   DISCHARGE INSTRUCTIONS AND FOLLOWUP: Follow with Dr. Kirke Corin next week.  [facility] in 1 week. The patient to have BMP checked at [facility]. Fluid restriction of 200 mL for 24 hours.   TIME SPENT: 35 minutes spent on this discharge.    ____________________________ Lacie Scotts. Allena Katz, MD shp:lb D: 08/14/2013 10:52:02 ET T: 08/14/2013 11:30:22 ET JOB#: [id number]  cc: Chasitty Hehl H. Allena Katz, MD, <Dictator> Charise Carwin MD ELECTRONICALLY SIGNED 08/14/2013 17:11

## 2014-11-13 NOTE — Discharge Summary (Signed)
PATIENT NAME:  Raymond Carpenter, Raymond Carpenter MR#:  [id number] DATE OF BIRTH:  08-28-1945  DATE OF ADMISSION:  08/27/2013 DATE OF DISCHARGE:  08/30/2013  ADMISSION DIAGNOSIS: Acute systolic heart failure.   DISCHARGE DIAGNOSES: 1.  Acute on chronic systolic heart failure with an ejection fraction of 15%.  2.  Acute on chronic respiratory failure.  3.  Hypokalemia.  4.  Hyponatremia from Lasix and diuresis.  5.  Chronic obstructive pulmonary disease.  6.  Severe malnutrition.   CONSULTATIONS: Nephrology.   LABORATORY DATA: At discharge sodium was 130.   PHYSICAL EXAMINATION: VITAL SIGNS: At discharge temperature is 97.5, pulse 77, respirations 20, blood pressure 103/74, and 97% on room air.  GENERAL: The patient was alert, oriented, in acute distress.  HEENT: Atraumatic. Pupils are round and reactive.  NECK: Supple without JVD, carotid bruit. LUNGS: Clear to auscultation. There are no crackles, rales, rhonchi, or wheezing.  CARDIOVASCULAR: Regular rate and rhythm. No murmurs, gallops, or rubs.  ABDOMEN: Bowel sounds are positive. Nontender, nondistended.  EXTREMITIES: No clubbing, cyanosis, or edema.  NEUROLOGIC: Cranial nerves II through XII are intact.  SKIN: Without rash or lesions.   HOSPITAL COURSE: A 69 year old male who presented with acute respiratory failure secondary to acute on chronic systolic heart failure. For further details, please refer to the H and P.  1.  Acute on chronic respiratory failure due to decompensated CHF. The patient was placed on IV Lasix, and his symptoms improved with IV Lasix. He was changed to p.o. Lasix and has showed much improvement on this.  2.  CHF, acute on chronic systolic heart failure with EF of 15%. The patient was on IV Lasix. His I's and O's were monitored. He is in negative balance. He will continue on Coreg and ACE inhibitor. He would benefit from a CHF nurse at discharge and this was ordered for him through case management.  3.  Hypokalemia, which  improved with replacement.  4.  Hyponatremia from Lasix and diuresis. We appreciate nephrology consult. He has had issues with this before. His hyponatremia is also due to his systolic heart failure. He was on placed tolvaptan. His sodium has stabilized on this medication.  5. COPD.  He did sound tight and he got 1 dose of steroids, but at discharge he does not have any issues. He will continue with his outpatient medications.  6.  Severe malnutrition. As per dietary, recommending Ensure.  DISCHARGE MEDICATIONS: 1.  Lisinopril 5 mg daily.  2.  Coreg 6.25 mg b.i.d.  3.  Magnesium oxide 400 mg daily.  4.  Ventolin HFA 4 times a day as needed.  5.  Aspirin 325 mg daily.  6.  Advair Diskus 250/50 b.i.d.  7.  Nitroglycerin sublingual p.r.n. chest pain.  8.  Spiriva 18 mcg daily. 9.  Trazodone 50 mg at bedtime.  10.  Lasix 20 mg b.i.d.  11.  Tolvaptan 15 mg daily.  12.  Ensure 2 times a day.   DISCHARGE HOME HEALTH: With nurse and CHF nurse.   DISCHARGE FOLLOWUP: The patient will follow up with Dr. Cherylann Ratel in 1 week and sodium levels will be checked by the nurse and sent to Dr. Garnett Farm office.   DISCHARGE DIET:  1.  Low sodium with Ensure twice a day.  2.  A 1200 mL fluid restriction, low sodium diet.   DISCHARGE ACTIVITY: No heavy lifting.   The patient is medically stable for discharge.   TIME SPENT ON DISCHARGE: 40 minutes.  ____________________________ Janyth Contes.  Juliene Pina, MD spm:sb D: 08/30/2013 13:53:05 ET T: 08/31/2013 07:21:55 ET JOB#: [id number]  cc: Amarah Brossman P. Juliene Pina, MD, <Dictator> Munsoor Lizabeth Leyden, MD Janyth Contes Corie Vavra MD ELECTRONICALLY SIGNED 08/31/2013 15:16

## 2015-02-01 ENCOUNTER — Encounter: Payer: Self-pay | Admitting: Cardiovascular Disease

## 2015-02-01 ENCOUNTER — Ambulatory Visit (INDEPENDENT_AMBULATORY_CARE_PROVIDER_SITE_OTHER): Payer: Medicare PPO | Admitting: Cardiovascular Disease

## 2015-02-01 VITALS — BP 100/58 | HR 74 | Ht 70.0 in | Wt 119.5 lb

## 2015-02-01 DIAGNOSIS — R0602 Shortness of breath: Secondary | ICD-10-CM

## 2015-02-01 DIAGNOSIS — I10 Essential (primary) hypertension: Secondary | ICD-10-CM

## 2015-02-01 DIAGNOSIS — I5022 Chronic systolic (congestive) heart failure: Secondary | ICD-10-CM | POA: Diagnosis not present

## 2015-02-01 DIAGNOSIS — I2510 Atherosclerotic heart disease of native coronary artery without angina pectoris: Secondary | ICD-10-CM | POA: Diagnosis not present

## 2015-02-01 NOTE — Progress Notes (Signed)
Primary care physician: Dr. Alda Ponder , MD at [facility] primary [facility] in [place].   HPI  This is a 69 year old man who is here today for a followup visit regarding chronic systolic heart failure with severely reduced LV systolic function.  He had  history of cardiomyopathy in the past with reported ejection fraction of 35% in 2004 which was not treated. He has known history of severe COPD and prolonged tobacco use. He was hospitalized at [facility] in January, 2015 with congestive heart failure . Echocardiogram showed an ejection fraction of 15-20% with mild mitral regurgitation and calcified aortic valve without significant stenosis.  He developed significant hyponatremia with changes in his mental status. This subsequently improved. He underwent a right and left cardiac catheterization which showed single-vessel left circumflex coronary artery disease which did not explain his degree of cardiomyopathy. No intervention was performed.  He was rehospitalized again on February, 2015 with acute on chronic heart failure with hyponatremia. He was treated with Tolvaptan .   His symptoms improved after the addition of digoxin. He had no hospitalization over the last year and he continues to be nicotine free. He is actually feeling better overall with no significant orthopnea, PND or leg edema. He continues to have dyspnea with moderate activities.     Allergies  Allergen Reactions  . Bupropion     Other reaction(s): Other (See Comments) Anorexia, irritability  . Spironolactone     Other reaction(s): Other (See Comments) Elevated potassium     Current Outpatient Prescriptions on File Prior to Visit  Medication Sig Dispense Refill  . albuterol (PROVENTIL HFA;VENTOLIN HFA) 108 (90 BASE) MCG/ACT inhaler Inhale 1 puff into the lungs 4 (four) times daily as needed for wheezing or shortness of breath.    Marland Kitchen atorvastatin (LIPITOR) 10 MG tablet Take 10 mg by mouth daily.    . carvedilol (COREG) 6.25 MG tablet  TAKE 1 TABLET (6.25 MG TOTAL) BY MOUTH 2 (TWO) TIMES DAILY WITH A MEAL. 60 tablet 3  . digoxin (LANOXIN) 0.125 MG tablet Take 1 tablet (0.125 mg total) by mouth daily. 30 tablet 6  . furosemide (LASIX) 20 MG tablet Take 20 mg by mouth 2 (two) times daily.    Marland Kitchen lisinopril (PRINIVIL,ZESTRIL) 5 MG tablet Take 5 mg by mouth daily.    . magnesium oxide (MAG-OX) 400 MG tablet Take 400 mg by mouth 2 (two) times daily.     . nitroGLYCERIN (NITROSTAT) 0.4 MG SL tablet Place 0.4 mg under the tongue every 5 (five) minutes as needed.     . Nutritional Supplements (CARNATION BREAKFAST ESSENTIALS PO) Take by mouth daily.     No current facility-administered medications on file prior to visit.     Past Medical History  Diagnosis Date  . Cardiomyopathy   . COPD (chronic obstructive pulmonary disease)   . CHF (congestive heart failure)   . MI (myocardial infarction)   . Syncope and collapse   . Chronic systolic heart failure     Most recent ejection fraction of 15-20% in January of 2015. Previous ejection fraction of 35% in 2004  . Coronary artery disease     Cardiac catheterization in January of 2015 showed severe one-vessel coronary artery disease with heavily calcified left circumflex and OM 1. Cardiomyopathy is out of proportion to coronary artery disease.  Marland Kitchen Hyperlipidemia   . Hypertension      Past Surgical History  Procedure Laterality Date  . Cardiac catheterization  1/15    [facility]  Family History  Problem Relation Age of Onset  . Heart disease Father   . Emphysema Father   . Heart attack Father      History   Social History  . Marital Status: Married    Spouse Name: N/A  . Number of Children: N/A  . Years of Education: N/A   Occupational History  . Not on file.   Social History Main Topics  . Smoking status: Never Smoker   . Smokeless tobacco: Not on file  . Alcohol Use: No  . Drug Use: No  . Sexual Activity: Not on file   Other Topics Concern  . Not on file    Social History Narrative      PHYSICAL EXAM   BP 100/58 mmHg  Pulse 74  Ht 5\' 10"  (1.778 m)  Wt 119 lb 8 oz (54.205 kg)  BMI 17.15 kg/m2   Constitutional: He is oriented to person, place, and time. He appears cachectic. No distress.  HENT: No nasal discharge.  Head: Normocephalic and atraumatic.  Eyes: Pupils are equal and round.  No discharge. Neck: Normal range of motion. Neck supple. Mild JVD present. No thyromegaly present.  Cardiovascular: Normal rate, regular rhythm, normal heart sounds. Exam reveals no gallop and no friction rub. No murmur heard.  Pulmonary/Chest: Effort normal and diminished breath sounds. No stridor. No respiratory distress. He has no wheezes. He has no rales. He exhibits no tenderness.  Abdominal: Soft. Bowel sounds are normal. He exhibits no distension. There is no tenderness. There is no rebound and no guarding.  Musculoskeletal: Normal range of motion. He exhibits mild edema and no tenderness.  Neurological: He is alert and oriented to person, place, and time. Coordination normal.  Skin: Skin is warm and dry. No rash noted. He is not diaphoretic. No erythema. No pallor.  Psychiatric: He has a normal mood and affect. His behavior is normal. Judgment and thought content normal.       ASSESSMENT AND PLAN

## 2015-02-01 NOTE — Assessment & Plan Note (Signed)
The patient seems to be doing reasonably well. There is no evidence of fluid overload. Symptoms include exertional dyspnea and fatigue and he continues to fluctuate between [place] heart Association class II and 3.  He is currently on optimal medical therapy. His blood pressure is low and might not allow the switch to [facility].  Given that his functional capacity overall improved and he quit smoking, he might be a reasonable candidate for an ICD placement if his ejection fraction is less than 35%. I requested an echocardiogram to evaluate this.

## 2015-02-01 NOTE — Patient Instructions (Addendum)
Medication Instructions:  Please continue current medications  Labwork: None  Testing/Procedures: Your physician has requested that you have an echocardiogram. Echocardiography is a painless test that uses sound waves to create images of your heart. It provides your doctor with information about the size and shape of your heart and how well your heart's chambers and valves are working. This procedure takes approximately one hour. There are no restrictions for this procedure.  Follow-Up: 6 months w/ Dr. Kirke Corin  Echocardiogram An echocardiogram, or echocardiography, uses sound waves (ultrasound) to produce an image of your heart. The echocardiogram is simple, painless, obtained within a short period of time, and offers valuable information to your health care provider. The images from an echocardiogram can provide information such as:  Evidence of coronary artery disease (CAD).  Heart size.  Heart muscle function.  Heart valve function.  Aneurysm detection.  Evidence of a past heart attack.  Fluid buildup around the heart.  Heart muscle thickening.  Assess heart valve function. LET [facility] CARE PROVIDER KNOW ABOUT:  Any allergies you have.  All medicines you are taking, including vitamins, herbs, eye drops, creams, and over-the-counter medicines.  Previous problems you or members of your family have had with the use of anesthetics.  Any blood disorders you have.  Previous surgeries you have had.  Medical conditions you have.  Possibility of pregnancy, if this applies. BEFORE THE PROCEDURE  No special preparation is needed. Eat and drink normally.  PROCEDURE   In order to produce an image of your heart, gel will be applied to your chest and a wand-like tool (transducer) will be moved over your chest. The gel will help transmit the sound waves from the transducer. The sound waves will harmlessly bounce off your heart to allow the heart images to be captured in  real-time motion. These images will then be recorded.  You may need an IV to receive a medicine that improves the quality of the pictures. AFTER THE PROCEDURE You may return to your normal schedule including diet, activities, and medicines, unless your health care provider tells you otherwise. Document Released: 07/06/2000 Document Revised: 11/23/2013 Document Reviewed: 03/16/2013 [facility] Patient Information 2015 [place], [place]. This information is not intended to replace advice given to you by your health care provider. Make sure you discuss any questions you have with your health care provider.

## 2015-02-01 NOTE — Assessment & Plan Note (Signed)
He has no symptoms suggestive of angina. Continue medical therapy. 

## 2015-02-01 NOTE — Assessment & Plan Note (Signed)
Blood pressure is controlled

## 2015-02-17 ENCOUNTER — Ambulatory Visit (INDEPENDENT_AMBULATORY_CARE_PROVIDER_SITE_OTHER): Payer: Medicare PPO

## 2015-02-17 ENCOUNTER — Other Ambulatory Visit: Payer: Self-pay

## 2015-02-17 DIAGNOSIS — R0602 Shortness of breath: Secondary | ICD-10-CM | POA: Diagnosis not present

## 2015-08-11 ENCOUNTER — Ambulatory Visit (INDEPENDENT_AMBULATORY_CARE_PROVIDER_SITE_OTHER): Payer: PPO | Admitting: Nurse Practitioner

## 2015-08-11 ENCOUNTER — Encounter: Payer: Self-pay | Admitting: *Deleted

## 2015-08-11 ENCOUNTER — Encounter: Payer: Self-pay | Admitting: Nurse Practitioner

## 2015-08-11 VITALS — BP 124/56 | HR 77 | Ht 70.0 in | Wt 130.4 lb

## 2015-08-11 DIAGNOSIS — I429 Cardiomyopathy, unspecified: Secondary | ICD-10-CM | POA: Diagnosis not present

## 2015-08-11 DIAGNOSIS — I5042 Chronic combined systolic (congestive) and diastolic (congestive) heart failure: Secondary | ICD-10-CM | POA: Diagnosis not present

## 2015-08-11 DIAGNOSIS — I110 Hypertensive heart disease with heart failure: Secondary | ICD-10-CM

## 2015-08-11 DIAGNOSIS — E785 Hyperlipidemia, unspecified: Secondary | ICD-10-CM

## 2015-08-11 DIAGNOSIS — I119 Hypertensive heart disease without heart failure: Secondary | ICD-10-CM | POA: Insufficient documentation

## 2015-08-11 DIAGNOSIS — I428 Other cardiomyopathies: Secondary | ICD-10-CM

## 2015-08-11 DIAGNOSIS — I2510 Atherosclerotic heart disease of native coronary artery without angina pectoris: Secondary | ICD-10-CM

## 2015-08-11 NOTE — Progress Notes (Signed)
Office Visit    Patient Name: Raymond Carpenter Date of Encounter: 08/11/2015  Primary Care Provider:  Dione Housekeeper, MD Primary Cardiologist:  Judie Petit. Kirke Corin, MD   Chief Complaint    70 year old male with history of nonischemic myopathy who presents for follow-up.  Past Medical History    Past Medical History  Diagnosis Date  . COPD (chronic obstructive pulmonary disease) (HCC)   . NICM (nonischemic cardiomyopathy) (HCC)     a. 2004 Echo: EF 35%;  b. 07/2013 Echo: 15-20%;  c. 01/2015 Echo: 45-50%.  . Syncope and collapse   . Chronic combined systolic and diastolic CHF (congestive heart failure) (HCC)     a. 2004 Echo: EF 35%;  b. 07/2013 Echo: 15-20%;  c. 01/2015 Echo: 45-50%, no rwma, Gr1 DD, PASP .  Marland Kitchen Coronary artery disease     a. 07/2013 Cath: LM nl, LAD nl, LCX 88m (Ca++), OM2 32m, RCA 65m-->med Rx.  Marland Kitchen Hyperlipidemia   . Hypertensive heart disease    Past Surgical History  Procedure Laterality Date  . Cardiac catheterization  1/15    [facility]   Allergies  Allergies  Allergen Reactions  . Bupropion     Other reaction(s): Other (See Comments) Anorexia, irritability  . Spironolactone     Other reaction(s): Other (See Comments) Elevated potassium    History of Present Illness    70 year old male with the above complex past medical history. He has a history of a nonischemic adenopathy with an EF once as low as 15-20% in January 2015. Catheterization at that time showed moderate, calcified left circumflex and obtuse marginal disease, which has been medically managed. Cardiomyopathy was felt to be out of proportion to his degree of coronary artery disease. Follow-up echo cardiac July 2016 showed an EF of 45-50%. Since his last visit in July 2016, Raymond Carpenter has been doing well. He weighs himself at least once per week, and has noticed a 10 pound weight gain, which he attributes to improved appetite. He has not been having any dyspnea exertion, chest pain, palpitations,  PND, orthopnea, dizziness, syncope, edema, or early satiety. He is fairly active, walking his dog several times per day for relatively long periods of time without any significant limitations. He quit smoking in January 2015 and has not had a cigarette since.  Home Medications    Prior to Admission medications   Medication Sig Start Date End Date Taking? Authorizing Provider  albuterol (PROVENTIL HFA;VENTOLIN HFA) 108 (90 BASE) MCG/ACT inhaler Inhale 1 puff into the lungs 4 (four) times daily as needed for wheezing or shortness of breath.   Yes Historical Provider, MD  aspirin 81 MG tablet Take 81 mg by mouth daily.   Yes Historical Provider, MD  carvedilol (COREG) 6.25 MG tablet TAKE 1 TABLET (6.25 MG TOTAL) BY MOUTH 2 (TWO) TIMES DAILY WITH A MEAL. 05/17/14  Yes Iran Ouch, MD  digoxin (LANOXIN) 0.125 MG tablet Take 1 tablet (0.125 mg total) by mouth daily. 04/16/14  Yes Iran Ouch, MD  furosemide (LASIX) 20 MG tablet Take 20 mg by mouth 2 (two) times daily.   Yes Historical Provider, MD  lisinopril (PRINIVIL,ZESTRIL) 5 MG tablet Take 5 mg by mouth daily.   Yes Historical Provider, MD  magnesium oxide (MAG-OX) 400 MG tablet Take 400 mg by mouth 2 (two) times daily.    Yes Historical Provider, MD  nitroGLYCERIN (NITROSTAT) 0.4 MG SL tablet Place 0.4 mg under the tongue every 5 (five) minutes as  needed for chest pain (3 doses max).  08/14/13  Yes Historical Provider, MD  Nutritional Supplements (CARNATION BREAKFAST ESSENTIALS PO) Take 1 tablet by mouth daily.    Yes Historical Provider, MD    Review of Systems    As above, patient has been doing exceptionally well since his last visit in July 2016.  He denies chest pain, palpitations, dyspnea, pnd, orthopnea, n, v, dizziness, syncope, edema, weight gain, or early satiety.  All other systems reviewed and are otherwise negative except as noted above.  Physical Exam    VS:  BP 124/56 mmHg  Pulse 77  Ht 5\' 10"  (1.778 m)  Wt 130 lb 6.4  oz (59.149 kg)  BMI 18.71 kg/m2 , BMI Body mass index is 18.71 kg/(m^2). GEN: Well nourished, well developed, in no acute distress. HEENT: normal. Neck: Supple, no JVD, carotid bruits, or masses. Cardiac: RRR, no murmurs, rubs, or gallops. No clubbing, cyanosis, edema.  Radials/DP/PT 2+ and equal bilaterally.  Respiratory:  Respirations regular and unlabored, diminished breath sounds bilaterally., BS + x 4. MS: no deformity or atrophy. Skin: warm and dry, no rash. Neuro:  Strength and sensation are intact. Psych: Normal affect.  Accessory Clinical Findings    ECG - regular sinus rhythm, 77, right atrial enlargement, PAC and PVC, no acute ST or T changes.   Assessment & Plan    1.  Nonischemic cardio myopathy/chronic combined systolic and diastolic congestive heart failure: Most recent EF in July 2016 had improved to 45-50%. Patient has been doing well since his last visit without any chest pain, dyspnea, or edema. He has been compliant with medications. He has only been weighing himself once a week or so.  We discussed the importance of daily weights, sodium restriction, medication compliance, and symptom reporting and he verbalizes understanding.  Continue beta blocker, ACE inhibitor, Lasix, and digoxin therapy. He did not tolerate spironolactone secondary to hyperkalemia.  2.  Coronary artery disease: Patient has known moderate to severe left circumflex and OM disease. He has been medically managed since general 2015 and has been doing well without chest pain or dyspnea. He is fairly active. He remains on aspirin and beta blocker therapy. Atorvastatin was initially on his medication list but he and his wife say they're not sure when he last took that. He murmurs having some issues with cramping but he isn't sure if that was with a statin or not. The last lipids that we have on hand are from January 2015 when his total cholesterol was 98 with an LDL of 59. I suspect that he was on statin therapy  at that time. He says his primary care provider follows his lipids and he is due to see her in 2 weeks. I would have a low threshold to initiate an alternate statin if LDL is greater than 70.  3. Hypertensive heart disease: Blood pressure is stable on beta blocker and ACE inhibitor therapy.  4. Hyperlipidemia: See #2. Currently not on statin. Would have low threshold to resume given history of coronary artery disease, provided that numbers are not significantly depressed in the absence of statin therapy.  5. COPD: Patient quit smoking in 2015. He was congratulated on not resuming smoking.  6. Disposition: Follow-up with Dr. Kirke Corin in 6 months or sooner if necessary.  Nicolasa Ducking, NP 08/11/2015, 8:30 AM

## 2015-08-11 NOTE — Patient Instructions (Signed)
Medication Instructions:  Your physician recommends that you continue on your current medications as directed. Please refer to the Current Medication list given to you today.   Labwork: none  Testing/Procedures: none  Follow-Up: Your physician wants you to follow-up in: six months with Dr. Arida.  You will receive a reminder letter in the mail two months in advance. If you don't receive a letter, please call our office to schedule the follow-up appointment.   Any Other Special Instructions Will Be Listed Below (If Applicable).     If you need a refill on your cardiac medications before your next appointment, please call your pharmacy.   

## 2015-09-23 IMAGING — CT CT HEAD WITHOUT AND WITH CONTRAST
1 of 2 series · 13 of 30 positions shown, 17 images · IV contrast (agent unspecified)
Comparison: None.

CLINICAL DATA: Altered mental status

EXAM:
CT HEAD WITHOUT AND WITH CONTRAST
TECHNIQUE: Contiguous axial images were obtained from the base of the skull
through the vertex without and with intravenous contrast
CONTRAST:  50 cc [id number]

[Series 2: head wo · axial · 0.43mm/px · z∈[+1272,+1402]mm · 13 of 32 slices shown, 17 images]
[im 3/32  brain]
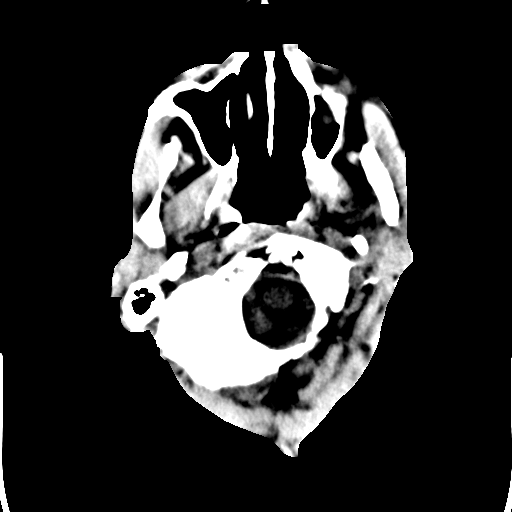
[im 3/32  bone]
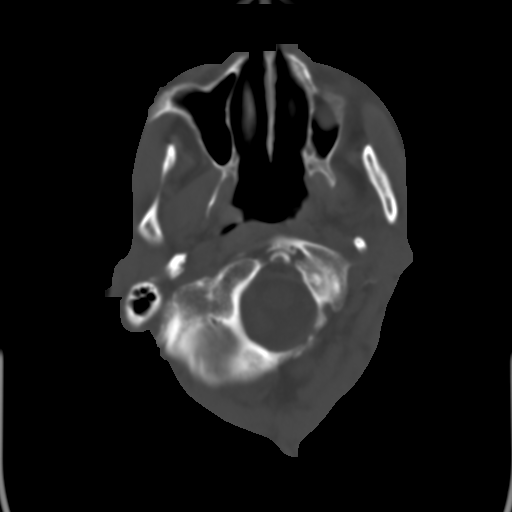
[im 5/32  brain]
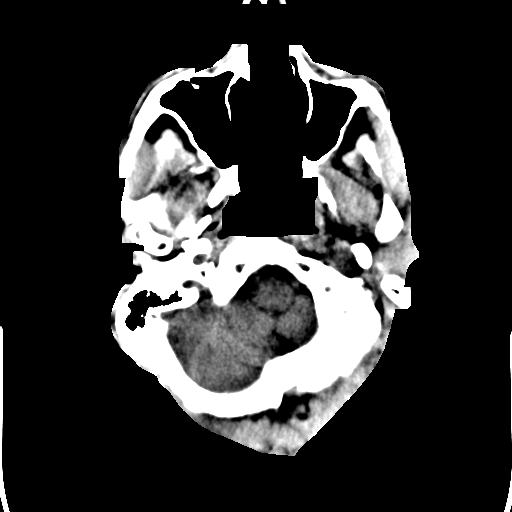
[im 7/32  brain]
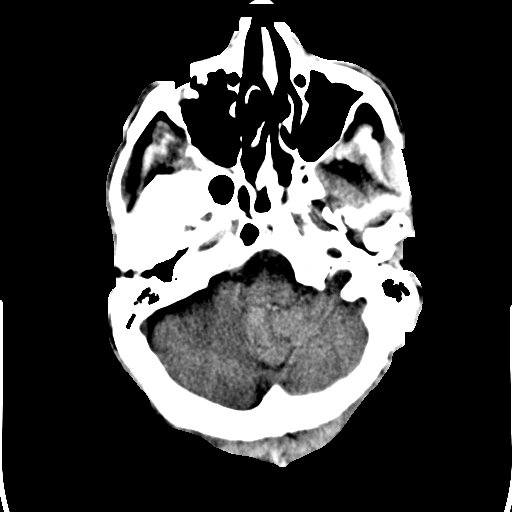
[im 9/32  brain]
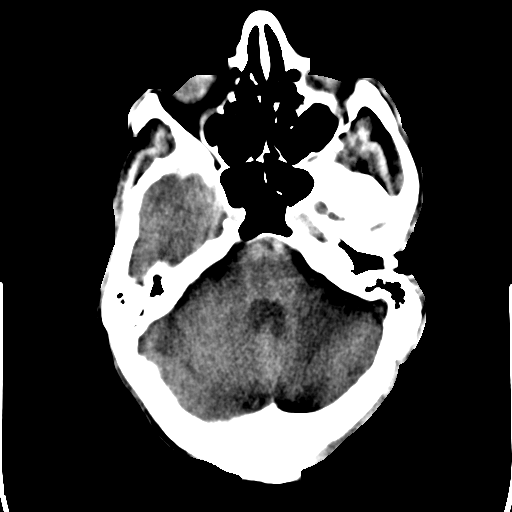
[im 12/32  brain]
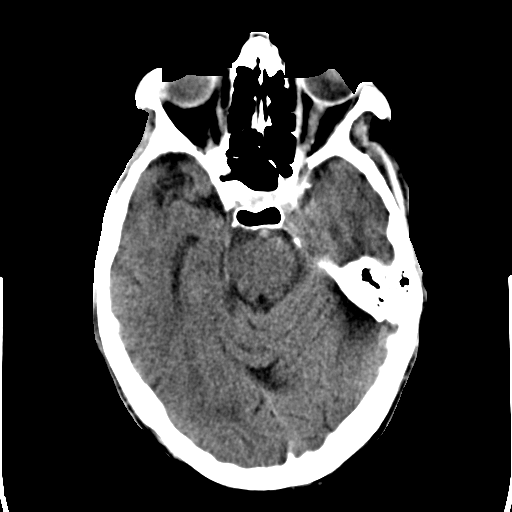
[im 12/32  bone]
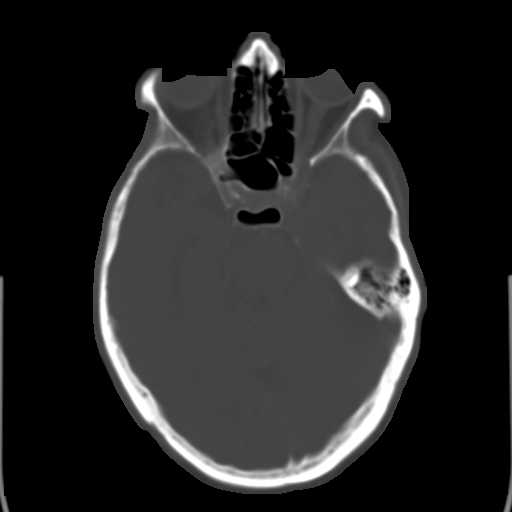
[im 14/32  brain]
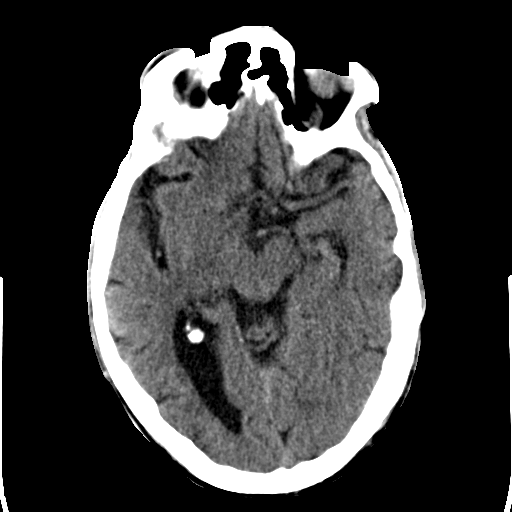
[im 16/32  brain]
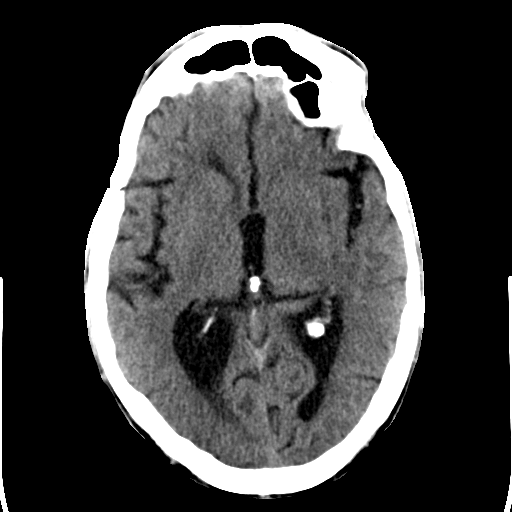
[im 18/32  brain]
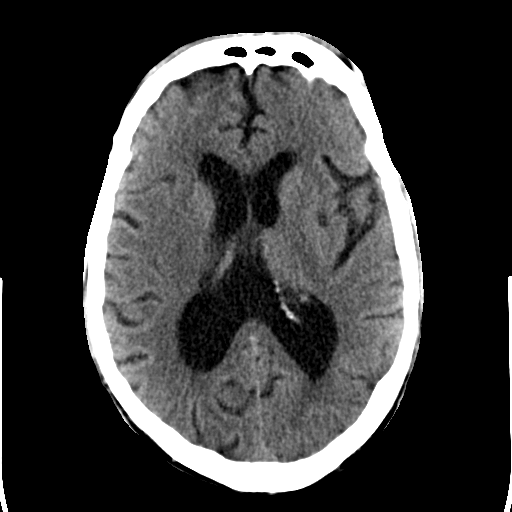
[im 20/32  brain]
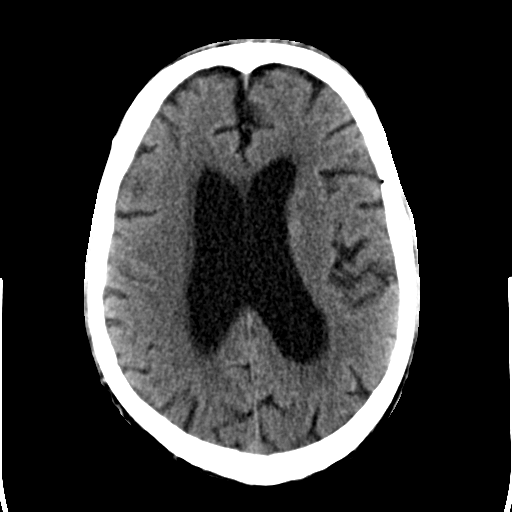
[im 20/32  bone]
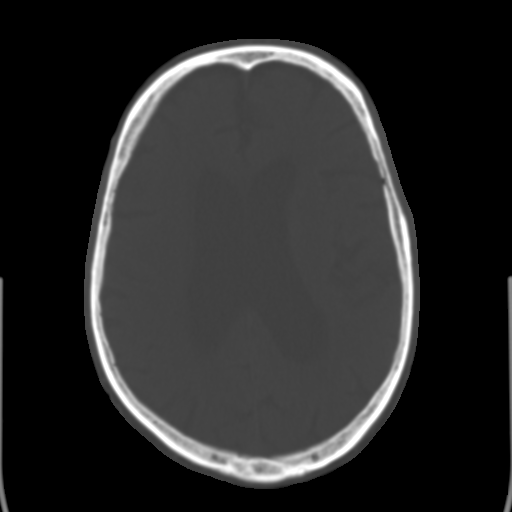
[im 23/32  brain]
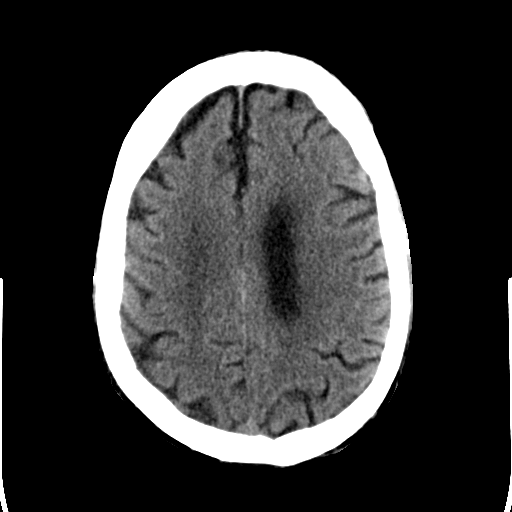
[im 25/32  brain]
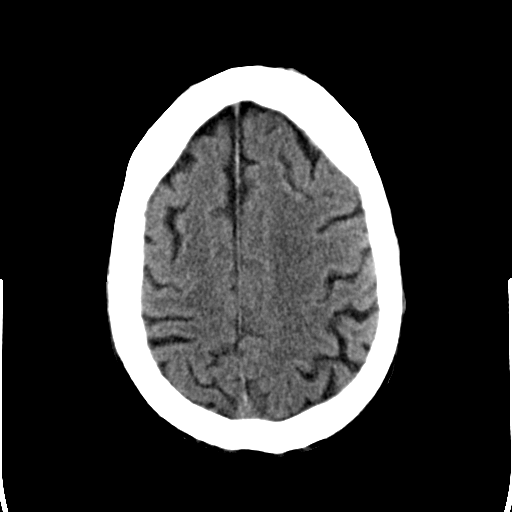
[im 27/32  brain]
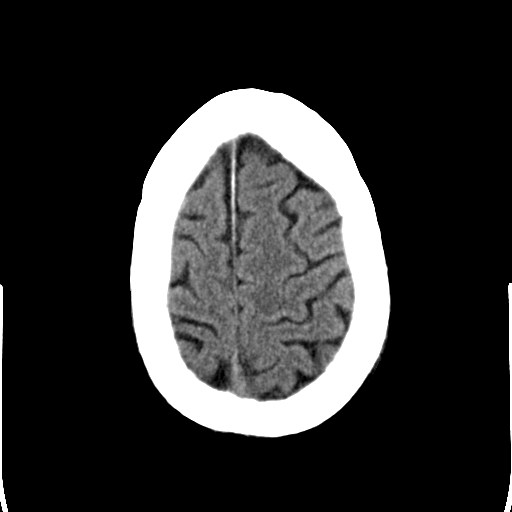
[im 29/32  brain]
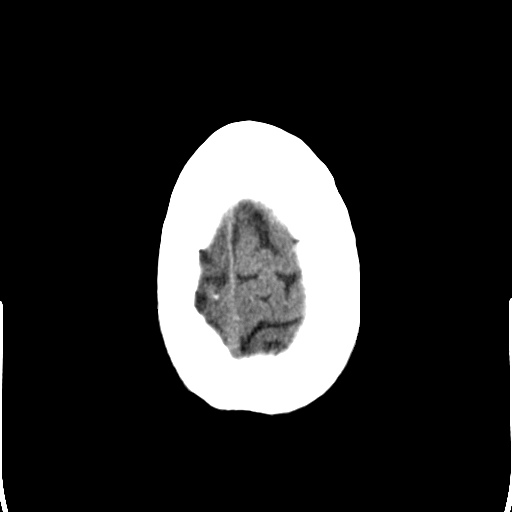
[im 29/32  bone]
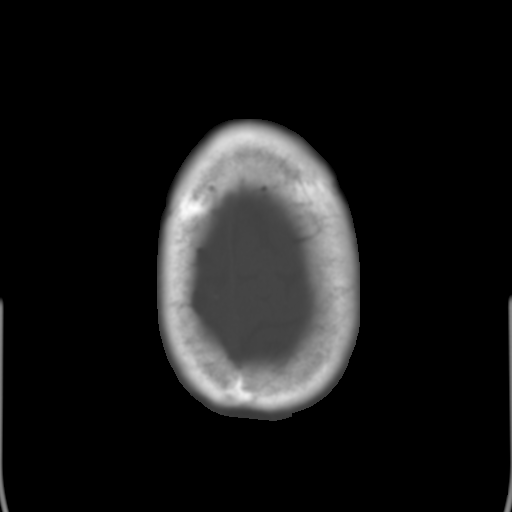

[13 of 30 positions shown; findings below may reference images not displayed]

FINDINGS: Global atrophy. Chronic ischemic changes in the periventricular
white matter. Lacunar infarct in the left basal ganglia. No mass
effect, midline shift, acute intracranial hemorrhage, or abnormal
enhancement. Mastoid air cells are clear. Cranium is intact.
Visualized paranasal sinuses are clear.
IMPRESSION: No acute intracranial pathology.

## 2016-05-03 ENCOUNTER — Encounter: Payer: Self-pay | Admitting: Cardiovascular Disease

## 2016-05-03 ENCOUNTER — Ambulatory Visit (INDEPENDENT_AMBULATORY_CARE_PROVIDER_SITE_OTHER): Payer: PPO | Admitting: Cardiovascular Disease

## 2016-05-03 VITALS — BP 130/70 | HR 73 | Ht 69.0 in | Wt 132.5 lb

## 2016-05-03 DIAGNOSIS — I5022 Chronic systolic (congestive) heart failure: Secondary | ICD-10-CM

## 2016-05-03 DIAGNOSIS — E7800 Pure hypercholesterolemia, unspecified: Secondary | ICD-10-CM | POA: Diagnosis not present

## 2016-05-03 DIAGNOSIS — I2510 Atherosclerotic heart disease of native coronary artery without angina pectoris: Secondary | ICD-10-CM

## 2016-05-03 MED ORDER — ATORVASTATIN CALCIUM 20 MG PO TABS: 20.0000 mg | ORAL_TABLET | Freq: Every day | ORAL | 3 refills | Status: AC

## 2016-05-03 NOTE — Patient Instructions (Signed)
Medication Instructions:  Your physician has recommended you make the following change in your medication:  START taking atorvastatin 20mg  once daily   Labwork: Fasting lipid and liver profile and digoxin level in 6 weeks.   Testing/Procedures: none  Follow-Up: Your physician wants you to follow-up in: six months with Dr. Kirke Corin.  You will receive a reminder letter in the mail two months in advance. If you don't receive a letter, please call our office to schedule the follow-up appointment.   Any Other Special Instructions Will Be Listed Below (If Applicable).     If you need a refill on your cardiac medications before your next appointment, please call your pharmacy.

## 2016-05-03 NOTE — Progress Notes (Signed)
Cardiology Office Note   Date:  05/03/2016   ID:  Raymond Carpenter, DOB 09/25/1945, MRN [id number]  PCP:  Olmedo, Mario Ernesto, MD  Cardiologist:   Graeson Nouri, MD   Chief Complaint  Patient presents with  . other    6 month follow up.       History of Present Illness: Raymond Carpenter is a 70 y.o. male who presents for a followup visit regarding chronic systolic heart failure .   He has known history of severe COPD due to previous tobacco use.  Ejection fraction was 15-20% in January 2015. A right and left cardiac catheterization at that time showed single-vessel left circumflex coronary artery disease which did not explain his degree of cardiomyopathy. No intervention was performed.  He quit smoking in 2015. He has chronic problems with hyponatremia. He had a repeat echocardiogram in July 2016 which showed an ejection fraction of 45-50%, grade 1 diastolic dysfunction and mild pulmonary hypertension. He has been doing extremely well with no chest pain or worsening dyspnea. No orthopnea, PND or leg edema.   Past Medical History:  Diagnosis Date  . Chronic combined systolic and diastolic CHF (congestive heart failure) (HCC)    a. 2004 Echo: EF [phone]1610513-[place] [phone] [phone] Rehabilitation [phone] [phone]-RoaWolf Eye Associates [phone] Hospital [facility] NelsonvilleDigestive [phone] Rehabilitation [id number] Josephs [phone] Mirage [phone]-PoMusc Medical CenterStAlpine N1610640-[phone]-MLake [phone]-MiLovelace Westside HospitalSurgica1610704-Chi430 [phone] [phone] Surgery CenterLynnwood-Pr1610(289)[facility]31Pearson [phone]-MChildren'S Hospital [facility] CentralTEyer1610450 Va [phone] [phone]-[facility] F[facility]ontCherokee1610703-[facility]77Pearson GriAdvan6ElbertEncompass Health RehabiChickasawBeryl Meageroseital Of Virginiamorial Hospital11Pearson GriAdvan6ElbertSt.B<MEASUREMENTGN:FAO th: LM nl, LAD nl, LCX 32m (Ca++), OM2 17m, RCA 82m-->med Rx.  . Hyperlipidemia   . Hypertensive heart disease   . NICM (nonischemic cardiomyopathy) (HCC)    a. 2004 Echo: EF 35%;  b. 07/2013 Echo: 15-20%;  c. 01/2015 Echo: 45-50%.  . Syncope and collapse     Past Surgical History:  Procedure Laterality Date  . CARDIAC CATHETERIZATION  1/15   [facility]     Current Outpatient Prescriptions  Medication Sig Dispense Refill  . aspirin 81 MG tablet Take 81 mg by mouth daily.    . carvedilol (COREG) 6.25 MG tablet TAKE 1 TABLET (6.25 MG TOTAL) BY MOUTH 2 (TWO)  TIMES DAILY WITH A MEAL. 60 tablet 3  . digoxin (LANOXIN) 0.125 MG tablet Take 1 tablet (0.125 mg total) by mouth daily. 30 tablet 6  . fluticasone furoate-vilanterol (BREO ELLIPTA) 200-25 MCG/INH AEPB Inhale 1 puff into the lungs daily.    . furosemide (LASIX) 20 MG tablet Take 20 mg by mouth 2 (two) times daily.    . lisinopril (PRINIVIL,ZESTRIL) 5 MG tablet Take 5 mg by mouth daily.    . magnesium oxide (MAG-OX) 400 MG tablet Take 400 mg by mouth 2 (two) times daily.     . nitroGLYCERIN (NITROSTAT) 0.4 MG SL tablet Place 0.4 mg under the tongue every 5 (five) minutes as needed for chest pain (3 doses max).     . Nutritional Supplements (CARNATION BREAKFAST ESSENTIALS PO) Take 1 tablet by mouth daily.     . atorvastatin (LIPITOR) 20 MG tablet Take 1 tablet (20 mg total) by mouth daily. 90 tablet 3   No current facility-administered medications for this visit.     Allergies:   Bupropion and Spironolactone    Social History:  The patient  reports that he has quit smoking. His smoking use included Cigarettes. He has a 40.00 pack-year smoking history. He has never used smokeless tobacco. He reports that he does not drink alcohol or use drugs.   Family History:  The patient's family  history includes Emphysema in his father; Heart attack in his father; Heart disease in his father.    ROS:  Please see the history of present illness.   Otherwise, review of systems are positive for none.   All other systems are reviewed and negative.    PHYSICAL EXAM: VS:  BP 130/70 (BP Location: Left Arm, Patient Position: Sitting, Cuff Size: Normal)   Pulse 73   Ht 5\' 9"  (1.753 m)   Wt 132 lb 8 oz (60.1 kg)   BMI 19.57 kg/m  , BMI Body mass index is 19.57 kg/m. GEN: Well nourished, well developed, in no acute distress  HEENT: normal  Neck: no JVD, carotid bruits, or masses Cardiac: RRR; no murmurs, rubs, or gallops,no edema  Respiratory:  clear to auscultation bilaterally, normal work of breathing GI:  soft, nontender, nondistended, + BS MS: no deformity or atrophy  Skin: warm and dry, no rash Neuro:  Strength and sensation are intact Psych: euthymic mood, full affect   EKG:  EKG is ordered today. The ekg ordered today demonstrates normal sinus rhythm with possible old inferior infarct.   Recent Labs: No results found for requested labs within last 8760 hours.    Lipid Panel    Component Value Date/Time   CHOL 98 08/06/2013 0157   TRIG 80 08/06/2013 0157   HDL 23 (L) 08/06/2013 0157   VLDL 16 08/06/2013 0157   LDLCALC 59 08/06/2013 0157      Wt Readings from Last 3 Encounters:  05/03/16 132 lb 8 oz (60.1 kg)  08/11/15 130 lb 6.4 oz (59.1 kg)  02/01/15 119 lb 8 oz (54.2 kg)      No flowsheet data found.    ASSESSMENT AND PLAN:  1.  Chronic systolic heart failure due to nonischemic cardiomyopathy. Most recent ejection fraction improved significantly to 45-50%. He is currently on optimal medical therapy and appears to be euvolemic. He did have mild hyperkalemia on his most recent labs which subsequently improved to 5. Thus, I'm going to continue lisinopril. I will check digoxin level. I might consider stopping this medication in the future.  2. Coronary artery disease involving native coronary arteries without angina: He has no anginal symptoms with known one-vessel coronary artery disease. Continue medical therapy and low-dose aspirin.  3. Hyperlipidemia: I reviewed his labs from July which showed a cholesterol of 176, LDL of 1:15, HDL of 47 and triglyceride of 71. Given that he has underlying coronary artery disease, I recommend a target LDL of less than 70. Thus, I added atorvastatin.    Disposition:   FU with me in 6 months  Signed,  Lorine Bears, MD  05/03/2016 4:57 PM    [facility]

## 2016-05-23 DEATH — deceased
